# Patient Record
Sex: Male | Born: 1970 | Race: White | Hispanic: No | Marital: Married | Smoking: Never smoker
Health system: Southern US, Community
[De-identification: ages and names within clinical notes are randomized; demographics above are authoritative.]

## PROBLEM LIST (undated history)

## (undated) DIAGNOSIS — K5792 Diverticulitis of intestine, part unspecified, without perforation or abscess without bleeding: Secondary | ICD-10-CM

## (undated) HISTORY — DX: Diverticulitis of intestine, part unspecified, without perforation or abscess without bleeding: K57.92

## (undated) HISTORY — PX: FRACTURE SURGERY: SHX138

## (undated) HISTORY — PX: VASECTOMY: SHX75

---

## 2013-10-09 ENCOUNTER — Ambulatory Visit (INDEPENDENT_AMBULATORY_CARE_PROVIDER_SITE_OTHER): Payer: 59 | Admitting: Family Medicine

## 2013-10-09 VITALS — BP 120/80 | HR 75 | Temp 98.1°F | Resp 16 | Ht 67.0 in | Wt 181.0 lb

## 2013-10-09 DIAGNOSIS — S39012A Strain of muscle, fascia and tendon of lower back, initial encounter: Secondary | ICD-10-CM

## 2013-10-09 DIAGNOSIS — M549 Dorsalgia, unspecified: Secondary | ICD-10-CM

## 2013-10-09 DIAGNOSIS — S335XXA Sprain of ligaments of lumbar spine, initial encounter: Secondary | ICD-10-CM

## 2013-10-09 MED ORDER — CYCLOBENZAPRINE HCL 10 MG PO TABS: 10.0000 mg | ORAL_TABLET | Freq: Three times a day (TID) | ORAL | Status: DC | PRN

## 2013-10-09 MED ORDER — HYDROCODONE-ACETAMINOPHEN 5-325 MG PO TABS: 1.0000 | ORAL_TABLET | Freq: Four times a day (QID) | ORAL | Status: DC | PRN

## 2013-10-09 MED ORDER — NAPROXEN 500 MG PO TABS: 500.0000 mg | ORAL_TABLET | Freq: Two times a day (BID) | ORAL | Status: DC

## 2013-10-09 NOTE — Progress Notes (Addendum)
Subjective: A week ago on Sunday patient was exercising lifting a heavy tire at work and strained his back a little bit. The next day [place] is something concrete at home and strained it badly. It is persisted in hurting. He did work 1 day, missed a day or 2. He is scheduled to work Monday again. He is a Airline pilot. No prior back injuries. He did not file this as workers comp.  Objective Significant distress with pain in his right low back. Abdomen soft without mass or tenderness. No left-sided tenderness. On the right side above the SI and iliac crest she is extremely tender. Spine is nontender. Straight leg raising is negative but the muscles tighten up. No radiation of pain.  Assessment: Muscle strain low back Muscle pain low back  Been: Muscle relaxants, anti-inflammatory, and pain medication Gave him a care of your back booklet. 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 I gave him an excuse to be out of his Monday and Tuesday classes, but he is to return if worse. If he is medically better he can return to class. A long discussion about care of his back.

## 2013-10-09 NOTE — Patient Instructions (Addendum)
Take the Flexeril one half to one pill 3 times daily as needed for muscle relaxant  Take the naproxen one twice daily with breakfast and supper  Use the hydrocodone pain medication every 4-6 hours if needed for severe pain  Return if worse or not improving  Use heat or ice or alternate the 2 as needed. Gentle massage might help. Avoid lifting and straining. Do gentle stretches of the back.  Read the back owners manual.  Use work excuse if not doing much much better by Monday. If symptoms persist such that you cannot go to work by Wednesday please return

## 2014-04-10 ENCOUNTER — Encounter (HOSPITAL_COMMUNITY): Payer: Self-pay | Admitting: Emergency Medicine

## 2014-04-10 ENCOUNTER — Emergency Department (HOSPITAL_COMMUNITY)
Admission: EM | Admit: 2014-04-10 | Discharge: 2014-04-10 | Disposition: A | Discharge status: Nursing Home (Includes ICF) | Payer: 59 | Source: Home / Self Care | Attending: Family Medicine | Admitting: Family Medicine

## 2014-04-10 ENCOUNTER — Emergency Department (HOSPITAL_COMMUNITY): Payer: 59

## 2014-04-10 ENCOUNTER — Emergency Department (HOSPITAL_COMMUNITY)
Admission: EM | Admit: 2014-04-10 | Discharge: 2014-04-10 | Disposition: A | Discharge status: Home / Self Care | Payer: 59 | Attending: Emergency Medicine | Admitting: Emergency Medicine

## 2014-04-10 DIAGNOSIS — R109 Unspecified abdominal pain: Secondary | ICD-10-CM

## 2014-04-10 DIAGNOSIS — Z9852 Vasectomy status: Secondary | ICD-10-CM | POA: Insufficient documentation

## 2014-04-10 DIAGNOSIS — N23 Unspecified renal colic: Secondary | ICD-10-CM | POA: Insufficient documentation

## 2014-04-10 LAB — URINALYSIS, ROUTINE W REFLEX MICROSCOPIC
BILIRUBIN URINE: NEGATIVE
Glucose, UA: NEGATIVE mg/dL
Hgb urine dipstick: NEGATIVE
Ketones, ur: NEGATIVE mg/dL
Leukocytes, UA: NEGATIVE
Nitrite: NEGATIVE
Protein, ur: NEGATIVE mg/dL
Specific Gravity, Urine: 1.023 (ref 1.005–1.030)
Urobilinogen, UA: 0.2 mg/dL (ref 0.0–1.0)
pH: 7 (ref 5.0–8.0)

## 2014-04-10 LAB — COMPREHENSIVE METABOLIC PANEL
ALT: 44 U/L (ref 0–53)
AST: 24 U/L (ref 0–37)
Albumin: 4.8 g/dL (ref 3.5–5.2)
Alkaline Phosphatase: 77 U/L (ref 39–117)
Anion gap: 18 — ABNORMAL HIGH (ref 5–15)
BILIRUBIN TOTAL: 1.2 mg/dL (ref 0.3–1.2)
BUN: 14 mg/dL (ref 6–23)
CHLORIDE: 103 meq/L (ref 96–112)
CO2: 20 meq/L (ref 19–32)
CREATININE: 0.82 mg/dL (ref 0.50–1.35)
Calcium: 9.9 mg/dL (ref 8.4–10.5)
GLUCOSE: 158 mg/dL — AB (ref 70–99)
Potassium: 4.1 mEq/L (ref 3.7–5.3)
Sodium: 141 mEq/L (ref 137–147)
Total Protein: 7.8 g/dL (ref 6.0–8.3)

## 2014-04-10 LAB — CBC WITH DIFFERENTIAL/PLATELET
BASOS ABS: 0 10*3/uL (ref 0.0–0.1)
Basophils Relative: 0 % (ref 0–1)
EOS PCT: 2 % (ref 0–5)
Eosinophils Absolute: 0.2 10*3/uL (ref 0.0–0.7)
HCT: 47.1 % (ref 39.0–52.0)
Hemoglobin: 17.2 g/dL — ABNORMAL HIGH (ref 13.0–17.0)
LYMPHS PCT: 27 % (ref 12–46)
Lymphs Abs: 2.2 10*3/uL (ref 0.7–4.0)
MCH: 32.8 pg (ref 26.0–34.0)
MCHC: 36.5 g/dL — ABNORMAL HIGH (ref 30.0–36.0)
MCV: 89.7 fL (ref 78.0–100.0)
Monocytes Absolute: 0.4 10*3/uL (ref 0.1–1.0)
Monocytes Relative: 6 % (ref 3–12)
NEUTROS PCT: 65 % (ref 43–77)
Neutro Abs: 5.2 10*3/uL (ref 1.7–7.7)
PLATELETS: 283 10*3/uL (ref 150–400)
RBC: 5.25 MIL/uL (ref 4.22–5.81)
RDW: 11.5 % (ref 11.5–15.5)
WBC: 8 10*3/uL (ref 4.0–10.5)

## 2014-04-10 LAB — LIPASE, BLOOD: Lipase: 35 U/L (ref 11–59)

## 2014-04-10 MED ORDER — SODIUM CHLORIDE 0.9 % IV BOLUS (SEPSIS)
1000.0000 mL | Freq: Once | INTRAVENOUS | Status: AC
  Administered 2014-04-10: 1000 mL via INTRAVENOUS

## 2014-04-10 MED ORDER — ONDANSETRON HCL 4 MG/2ML IJ SOLN
4.0000 mg | Freq: Once | INTRAMUSCULAR | Status: AC
  Administered 2014-04-10: 4 mg via INTRAVENOUS
  Filled 2014-04-10: qty 2

## 2014-04-10 MED ORDER — OXYCODONE-ACETAMINOPHEN 5-325 MG PO TABS: 1.0000 | ORAL_TABLET | Freq: Four times a day (QID) | ORAL | Status: DC | PRN

## 2014-04-10 MED ORDER — MORPHINE SULFATE 4 MG/ML IJ SOLN: 4.0000 mg | Freq: Once | INTRAMUSCULAR | Status: DC

## 2014-04-10 MED ORDER — HYDROMORPHONE HCL 1 MG/ML IJ SOLN
1.0000 mg | Freq: Once | INTRAMUSCULAR | Status: AC
  Administered 2014-04-10: 1 mg via INTRAVENOUS
  Filled 2014-04-10: qty 1

## 2014-04-10 MED ORDER — ONDANSETRON HCL 4 MG PO TABS: 4.0000 mg | ORAL_TABLET | Freq: Four times a day (QID) | ORAL | Status: DC

## 2014-04-10 NOTE — ED Provider Notes (Signed)
CSN: [id number]     Arrival date & time 04/10/14  1011 History   First MD Initiated Contact with Patient 04/10/14 1034     Chief Complaint  Patient presents with  . Abdominal Pain     (Consider location/radiation/quality/duration/timing/severity/associated sxs/prior Treatment) HPI  Patient to the ED with complaints of severe abdominal pains acute onset this morning. It is left sided flank, back pain and lower left abdominal pain that radiates down into his groin. The patient is unable to sit still and is pacing around the room. He denies any hematuria, diarrhea, fevers, chills, vomiting. Feels nauseas, denies hx of stones or intraabdominal surgery.  History reviewed. No pertinent past medical history. Past Surgical History  Procedure Laterality Date  . Fracture surgery    . Vasectomy     No family history on file. History  Substance Use Topics  . Smoking status: Never Smoker   . Smokeless tobacco: Not on file  . Alcohol Use: No    Review of Systems  All other systems reviewed and are negative.     Allergies  Review of patient's allergies indicates no known allergies.  Home Medications   Prior to Admission medications   Medication Sig Start Date End Date Taking? Authorizing Provider  ibuprofen (ADVIL,MOTRIN) 200 MG tablet Take 400 mg by mouth every 6 (six) hours as needed (pain).   Yes Historical Provider, MD  ondansetron (ZOFRAN) 4 MG tablet Take 1 tablet (4 mg total) by mouth every 6 (six) hours. 04/10/14   Mable Dara Marilu Favre, PA-C  oxyCODONE-acetaminophen (PERCOCET/ROXICET) 5-325 MG per tablet Take 1-2 tablets by mouth every 6 (six) hours as needed. 04/10/14   Starlyn Droge Marilu Favre, PA-C   BP 118/77  Pulse 51  Temp(Src) 98.2 F (36.8 C) (Oral)  Resp 20  SpO2 99% Physical Exam  Nursing note and vitals reviewed. Constitutional: He appears well-developed and well-nourished. He appears distressed (severe pain).  HENT:  Head: Normocephalic and atraumatic.  Eyes: Pupils are  equal, round, and reactive to light.  Neck: Normal range of motion. Neck supple.  Cardiovascular: Normal rate and regular rhythm.   Pulmonary/Chest: Effort normal.  Abdominal: Soft. Bowel sounds are normal. There is tenderness (severe) in the right lower quadrant. There is guarding and CVA tenderness (left). There is no rebound.  Neurological: He is alert.  Skin: Skin is warm. He is diaphoretic.    ED Course  Procedures (including critical care time) Labs Review Labs Reviewed  COMPREHENSIVE METABOLIC PANEL - Abnormal; Notable for the following:    Glucose, Bld 158 (*)    Anion gap 18 (*)    All other components within normal limits  CBC WITH DIFFERENTIAL - Abnormal; Notable for the following:    Hemoglobin 17.2 (*)    MCHC 36.5 (*)    All other components within normal limits  LIPASE, BLOOD  URINALYSIS, ROUTINE W REFLEX MICROSCOPIC    Imaging Review Ct Abdomen Pelvis Wo Contrast  04/10/2014   CLINICAL DATA:  Left flank pain.  EXAM: CT ABDOMEN AND PELVIS WITHOUT CONTRAST  TECHNIQUE: Multidetector CT imaging of the abdomen and pelvis was performed following the standard protocol without IV contrast.  COMPARISON:  None.  FINDINGS: Dependent atelectasis at the lung bases.  No evidence for free air.  Normal appearance of both kidneys without kidney stones or hydronephrosis. Normal appearance of the urinary bladder.  There are multiple low-density structures in the left hepatic lobe. The largest is in the lateral left hepatic lobe and measures up to  1.3 cm. These most likely represent small hepatic cysts. Normal appearance of the gallbladder. Normal appearance of the spleen, pancreas and adrenal glands.  There is mild stranding within the central abdominal mesentery which is nonspecific finding. There is no significant free fluid or lymphadenopathy. Few calcifications in the prostate gland. There is a retrocecal appendix without inflammation. No acute abnormality to the small or large bowel.  No  acute bone abnormality.  IMPRESSION: No acute abnormality in the abdomen or pelvis. Negative for kidney stones or hydronephrosis.  Multiple small low-density structures scattered throughout the liver. These are indeterminate on this non contrast examination but likely represent small cysts.  Mild stranding in the central abdominal mesentery without lymphadenopathy. This is probably an incidental finding.   Electronically Signed   By: Markus Daft M.D.   On: 04/10/2014 13:53     EKG Interpretation None      MDM   Final diagnoses:  Flank pain  Ureteral colic    Patients pain was left flank, low left back, LLQ and radiated to his groin. He was writhing in pain initially, unable to sit still and diaphoretic from the pain.  This resolved with IV pain medications. His urinalysis and labs have resulted not showing any abnormalities. His CT scan also shows no abnormalities. He is being clinically diagnosed with ureteral colic and flank pain as his symptoms are most consistent with this. He and his wife have been made aware that an early presentation of another illness may be brewing and if he develops new symptoms (n/v/dfevers/AMS) he needs to be seen right away.   Rx; Percocet and Zofran.  43 y.o.Gregory Bernard's evaluation in the Emergency Department is complete. It has been determined that no acute conditions requiring further emergency intervention are present at this time. The patient/guardian have been advised of the diagnosis and plan. We have discussed signs and symptoms that warrant return to the ED, such as changes or worsening in symptoms.  Vital signs are stable at discharge. Filed Vitals:   04/10/14 1345  BP: 118/77  Pulse: 51  Temp:   Resp:     Patient/guardian has voiced understanding and agreed to follow-up with the PCP or specialist.     Linus Mako, PA-C 04/10/14 1421

## 2014-04-10 NOTE — ED Notes (Signed)
Patient states having left sided flank pain that started this am Having lower abd pain as well

## 2014-04-10 NOTE — ED Notes (Signed)
Pt. Stated, lower abdominal pain that goess all across.  Its so much pressure. It goes to the groin area.

## 2014-04-10 NOTE — ED Provider Notes (Signed)
Medical screening examination/treatment/procedure(s) were performed by non-physician practitioner and as supervising physician I was immediately available for consultation/collaboration.     Veryl Speak, MD 04/10/14 1550

## 2014-04-10 NOTE — ED Provider Notes (Signed)
Gregory Bernard is a 43 y.o. male who presents to Urgent Care today for severe abdominal pain. Patient will this morning with sudden onset of severe left-sided flank pain radiating to his groin. He notes severe bilateral lower groin pain. He feels nauseated but has not vomited. He denies any blood in the urine but is unable to urinate currently. He denies any diarrhea. No fevers or chills. Patient tried some over-the-counter medications this morning which did not help. He denies any history of abdominal surgery or kidney stones.   History reviewed. No pertinent past medical history. History  Substance Use Topics  . Smoking status: Never Smoker   . Smokeless tobacco: Not on file  . Alcohol Use: No   ROS as above Medications: No current facility-administered medications for this encounter.   Current Outpatient Prescriptions  Medication Sig Dispense Refill  . cyclobenzaprine (FLEXERIL) 10 MG tablet Take 1 tablet (10 mg total) by mouth 3 (three) times daily as needed for muscle spasms.  20 tablet  1  . HYDROcodone-acetaminophen (NORCO) 5-325 MG per tablet Take 1 tablet by mouth every 6 (six) hours as needed.  30 tablet  0  . naproxen (NAPROSYN) 500 MG tablet Take 1 tablet (500 mg total) by mouth 2 (two) times daily with a meal.  30 tablet  0    Exam:  BP 144/91  Pulse 60  Temp(Src) 98.7 F (37.1 C) (Oral)  Resp 17  SpO2 100% Gen: In pain appearing HEENT: EOMI,  MMM Lungs: Normal work of breathing. CTABL Heart: RRR no MRG Abd:  hypoactive bowel sounds. Tender to percussion left flank. Tender to palpation bilateral lower abdomen with rebound and guarding present  Exts: Brisk capillary refill, warm and well perfused.   No results found for this or any previous visit (from the past 24 hour(s)). No results found.  Assessment and Plan: 43 y.o. male with  of left flank pain with severe bilateral lower abdominal pain with rebound and guarding. This is concerning for kidney stone or  appendicitis or diverticulitis. Essentially this patient requires more of a workup and we are able to perform at the urgent care location. Plan to transfer to the emergency department for further evaluation and management. I offered IV analgesics and transfer to the EMS. Patient declined. His wife will drive him to the emergency room now. Vital signs stable for transfer.  Discussed warning signs or symptoms. Please see discharge instructions. Patient expresses understanding.     Gregory Hams, MD 04/10/14 [phone]

## 2014-04-10 NOTE — ED Notes (Signed)
Pt. Reports sudden onset left flank pain radiating down to groin this AM. Denies urinary symptoms. Lower abdomen tender to touch. Went to urgent care first and sent here for further evaluation. Pt. Can not sit still in bed, appears to be in pain.

## 2014-04-10 NOTE — Discharge Instructions (Signed)
Flank Pain Flank pain refers to pain that is located on the side of the body between the upper abdomen and the back. The pain may occur over a short period of time (acute) or may be long-term or reoccurring (chronic). It may be mild or severe. Flank pain can be caused by many things. CAUSES  Some of the more common causes of flank pain include:  Muscle strains.   Muscle spasms.   A disease of your spine (vertebral disk disease).   A lung infection (pneumonia).   Fluid around your lungs (pulmonary edema).   A kidney infection.   Kidney stones.   A very painful skin rash caused by the chickenpox virus (shingles).   Gallbladder disease.  [place] care will depend on the cause of your pain. In general,  Rest as directed by your caregiver.  Drink enough fluids to keep your urine clear or pale yellow.  Only take over-the-counter or prescription medicines as directed by your caregiver. Some medicines may help relieve the pain.  Tell your caregiver about any changes in your pain.  Follow up with your caregiver as directed. SEEK IMMEDIATE MEDICAL CARE IF:   Your pain is not controlled with medicine.   You have new or worsening symptoms.  Your pain increases.   You have abdominal pain.   You have shortness of breath.   You have persistent nausea or vomiting.   You have swelling in your abdomen.   You feel faint or pass out.   You have blood in your urine.  You have a fever or persistent symptoms for more than 2-3 days.  You have a fever and your symptoms suddenly get worse. MAKE SURE YOU:   Understand these instructions.  Will watch your condition.  Will get help right away if you are not doing well or get worse. Document Released: 08/22/2005 Document Revised: 03/25/2012 Document Reviewed: 02/13/2012 [facility] Patient Information 2015 [place], [place]. This information is not intended to replace advice given to you by your  health care provider. Make sure you discuss any questions you have with your health care provider. Ureteral Colic (Kidney Stones) Ureteral colic is the result of a condition when kidney stones form inside the kidney. Once kidney stones are formed they may move into the tube that connects the kidney with the bladder (ureter). If this occurs, this condition may cause pain (colic) in the ureter.  CAUSES  Pain is caused by stone movement in the ureter and the obstruction caused by the stone. SYMPTOMS  The pain comes and goes as the ureter contracts around the stone. The pain is usually intense, sharp, and stabbing in character. The location of the pain may move as the stone moves through the ureter. When the stone is near the kidney the pain is usually located in the back and radiates to the belly (abdomen). When the stone is ready to pass into the bladder the pain is often located in the lower abdomen on the side the stone is located. At this location, the symptoms may mimic those of a urinary tract infection with urinary frequency. Once the stone is located here it often passes into the bladder and the pain disappears completely. TREATMENT   Your caregiver will provide you with medicine for pain relief.  You may require specialized follow-up X-rays.  The absence of pain does not always mean that the stone has passed. It may have just stopped moving. If the urine remains completely obstructed, it can  cause loss of kidney function or even complete destruction of the involved kidney. It is your responsibility and in your interest that X-rays and follow-ups as suggested by your caregiver are completed. Relief of pain without passage of the stone can be associated with severe damage to the kidney, including loss of kidney function on that side.  If your stone does not pass on its own, additional measures may be taken by your caregiver to ensure its removal. HOME CARE INSTRUCTIONS   Increase your fluid  intake. Water is the preferred fluid since juices containing vitamin C may acidify the urine making it less likely for certain stones (uric acid stones) to pass.  Strain all urine. A strainer will be provided. Keep all particulate matter or stones for your caregiver to inspect.  Take your pain medicine as directed.  Make a follow-up appointment with your caregiver as directed.  Remember that the goal is passage of your stone. The absence of pain does not mean the stone is gone. Follow your caregiver's instructions.  Only take over-the-counter or prescription medicines for pain, discomfort, or fever as directed by your caregiver. SEEK MEDICAL CARE IF:   Pain cannot be controlled with the prescribed medicine.  You have a fever.  Pain continues for longer than your caregiver advises it should.  There is a change in the pain, and you develop chest discomfort or constant abdominal pain.  You feel faint or pass out. MAKE SURE YOU:   Understand these instructions.  Will watch your condition.  Will get help right away if you are not doing well or get worse. Document Released: 04/10/2005 Document Revised: 10/26/2012 Document Reviewed: 12/26/2010 [facility] Patient Information 2015 [place], [place]. This information is not intended to replace advice given to you by your health care provider. Make sure you discuss any questions you have with your health care provider.

## 2017-01-20 ENCOUNTER — Encounter: Payer: Self-pay | Admitting: Adult Health

## 2017-01-20 ENCOUNTER — Ambulatory Visit (INDEPENDENT_AMBULATORY_CARE_PROVIDER_SITE_OTHER): Payer: 59 | Admitting: Adult Health

## 2017-01-20 VITALS — BP 118/73 | HR 93 | Ht 67.0 in | Wt 179.0 lb

## 2017-01-20 DIAGNOSIS — L259 Unspecified contact dermatitis, unspecified cause: Secondary | ICD-10-CM | POA: Insufficient documentation

## 2017-01-20 DIAGNOSIS — R21 Rash and other nonspecific skin eruption: Secondary | ICD-10-CM | POA: Diagnosis not present

## 2017-01-20 DIAGNOSIS — Z833 Family history of diabetes mellitus: Secondary | ICD-10-CM | POA: Insufficient documentation

## 2017-01-20 DIAGNOSIS — J029 Acute pharyngitis, unspecified: Secondary | ICD-10-CM | POA: Diagnosis not present

## 2017-01-20 DIAGNOSIS — Z0000 Encounter for general adult medical examination without abnormal findings: Secondary | ICD-10-CM | POA: Insufficient documentation

## 2017-01-20 DIAGNOSIS — L255 Unspecified contact dermatitis due to plants, except food: Secondary | ICD-10-CM

## 2017-01-20 LAB — POCT GLYCOSYLATED HEMOGLOBIN (HGB A1C): HEMOGLOBIN A1C: 5.9

## 2017-01-20 MED ORDER — PREDNISONE 20 MG PO TABS: 20.0000 mg | ORAL_TABLET | Freq: Every day | ORAL | 0 refills | Status: DC

## 2017-01-20 NOTE — Assessment & Plan Note (Signed)
A1c today 5.9 Advised that he is pre-diabetic and needs to reduce sugar/CHO intake and increase regular exercise.

## 2017-01-20 NOTE — Progress Notes (Signed)
Subjective:    Patient ID: Gregory Bernard, male    DOB: October 08, 1970, 46 y.o.   MRN: [id number]  HPI:  Gregory Bernard is here to establish as a new pt.  He is a pleasant 46 year old male.  PMH:  HL and diffuse rash on back that has been present for years.  Significant family hx of diabetes-mother and siblings.  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  He denies tobacco/EOTH use.  He reports drinking > 80 ounces water/daily, however also consumes a substantial amount of sweet tea.  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 He has not had regular, routine medical care in years, with only healthcare at annual [facility] for fire department. He denies regular rx medication. One acute complaint- productive cough that developed two weeks ago.  He took 5 days of friend Amoxicillin and sx's improved, however he has lingering non-productive cough with sore throat (3/10).  He denies fever/night sweats/N/V/D.  Patient Care Team    Relationship Specialty Notifications Start End  Gregory Bernard, Gregory Spare, NP PCP - General Family Medicine  01/20/17     There are no active problems to display for this patient.    Past Medical History:  Diagnosis Date  . Diverticulitis      Past Surgical History:  Procedure Laterality Date  . FRACTURE SURGERY    . VASECTOMY       Family History  Problem Relation Age of Onset  . Heart attack Mother   . Diabetes Mother   . Hiatal hernia Father   . Dementia Father   . Diabetes Brother      History  Drug Use No     History  Alcohol Use No     History  Smoking Status  . Never Smoker  Smokeless Tobacco  . Never Used     Outpatient Encounter Prescriptions as of 01/20/2017  Medication Sig  . Dextromethorphan-Guaifenesin (MUCINEX DM PO) Take by mouth.  . fexofenadine (ALLEGRA) 30 MG tablet Take 30 mg by mouth as needed.  Marland Kitchen ibuprofen (ADVIL,MOTRIN) 200 MG tablet Take  400 mg by mouth every 6 (six) hours as needed (pain).  . predniSONE (DELTASONE) 20 MG tablet Take 1 tablet (20 mg total) by mouth daily with breakfast. 3 tabs day one. 2 tabs days two-five  . [DISCONTINUED] ondansetron (ZOFRAN) 4 MG tablet Take 1 tablet (4 mg total) by mouth every 6 (six) hours.  . [DISCONTINUED] oxyCODONE-acetaminophen (PERCOCET/ROXICET) 5-325 MG per tablet Take 1-2 tablets by mouth every 6 (six) hours as needed.   No facility-administered encounter medications on file as of 01/20/2017.     Allergies: Patient has no known allergies.  Body mass index is 28.04 kg/m.  Blood pressure 118/73, pulse 93, height 5\' 7"  (1.702 m), weight 179 lb (81.2 kg).     Review of Systems  Constitutional: Positive for fatigue. Negative for activity change, appetite change, chills, diaphoresis, fever and unexpected weight change.  HENT: Positive for congestion, postnasal drip and sore throat. Negative for sinus pain, sinus pressure, sneezing, trouble swallowing and voice change.   Eyes: Negative for visual disturbance.  Respiratory: Positive for cough. Negative for chest tightness, shortness of breath, wheezing and stridor.   Cardiovascular: Negative for chest pain, palpitations and leg swelling.  Endocrine: Negative for cold intolerance, heat intolerance, polydipsia, polyphagia and polyuria.  Genitourinary: Negative for difficulty urinating, flank pain and hematuria.  Musculoskeletal: Negative  for arthralgias, back pain, gait problem, joint swelling, myalgias, neck pain and neck stiffness.  Skin: Positive for rash. Negative for color change and wound.  Allergic/Immunologic: Negative for immunocompromised state.  Neurological: Negative for dizziness and headaches.  Hematological: Does not bruise/bleed easily.  Psychiatric/Behavioral: Positive for sleep disturbance.       Objective:   Physical Exam  Constitutional: He is oriented to person, place, and time. He appears well-developed and  well-nourished. No distress.  HENT:  Head: Normocephalic and atraumatic.  Right Ear: Hearing, tympanic membrane, external ear and ear canal normal. Tympanic membrane is not perforated and not bulging. No decreased hearing is noted.  Left Ear: Hearing, tympanic membrane, external ear and ear canal normal. Tympanic membrane is not perforated and not bulging. No decreased hearing is noted.  Nose: Rhinorrhea present. No mucosal edema. Right sinus exhibits no maxillary sinus tenderness and no frontal sinus tenderness. Left sinus exhibits no maxillary sinus tenderness and no frontal sinus tenderness.  Mouth/Throat: Uvula is midline and mucous membranes are normal. Posterior oropharyngeal edema and posterior oropharyngeal erythema present. No oropharyngeal exudate or tonsillar abscesses.  Eyes: Conjunctivae are normal. Pupils are equal, round, and reactive to light.  Neck: Normal range of motion. Neck supple.  Cardiovascular: Normal rate, regular rhythm, normal heart sounds and intact distal pulses.   No murmur heard. Pulmonary/Chest: Effort normal and breath sounds normal. No respiratory distress. He has no wheezes. He has no rales. He exhibits no tenderness.  Lymphadenopathy:    He has no cervical adenopathy.  Neurological: He is alert and oriented to person, place, and time. Coordination normal.  Skin: Skin is warm and dry. Rash noted. He is not diaphoretic. No erythema. No pallor.  Diffuse, flat, reddened rash on back. Contact dermatitis noted on lower extremities with one area of excoriation on L calf-no signs of infection.  Psychiatric: He has a normal mood and affect. His behavior is normal. Judgment and thought content normal.  Nursing note and vitals reviewed.         Assessment & Plan:   1. Rash of back   2. Family history of type II diabetes mellitus   3. Contact dermatitis due to plants, except food, unspecified contact dermatitis type   4. Pharyngitis, unspecified etiology   5.  Healthcare maintenance     Rash of back Dermatology referral placed.  Family history of type II diabetes mellitus A1c today 5.9 Advised that he is pre-diabetic and needs to reduce sugar/CHO intake and increase regular exercise.  Contact dermatitis Came into contact with poison ivy last week while working at his [employer]. Prednisone taper provided. Advised not to scratch.  Pharyngitis Prednisone taper provided. Increase fluids/vit c Warm salt water gargle. Please call clinic if sx's persist after prednisone.  Healthcare maintenance Increase regular movement.  Follow heart healthy diet. Increase water intake and reduce sweet tea consumption. Increase regular exercise-jogging, walking, biking, weight training. A1c today-5.9, which means you are pre-diabetic.  Please reduce sugar (sweet tea) and carbohydrate intake. Dermatology referral placed. Please send in labs from beginning of the year. Recommend complete physical this Fall. Prednisone taper for contact dermatitis, sore throat. If cough/congestion/sore throat does not improve after Prednisone complete, then please call clinic. Please do not share antibiotics with others, increases risk of developing medication resistance.     FOLLOW-UP:  Return in about 3 months (around 04/22/2017) for CPE.

## 2017-01-20 NOTE — Patient Instructions (Addendum)
Heart-Healthy Eating Plan Many factors influence your heart health, including eating and exercise habits. Heart (coronary) risk increases with abnormal blood fat (lipid) levels. Heart-healthy meal planning includes limiting unhealthy fats, increasing healthy fats, and making other small dietary changes. This includes maintaining a healthy body weight to help keep lipid levels within a normal range. What is my plan? Your health care provider recommends that you:  Get no more than _________% of the total calories in your daily diet from fat.  Limit your intake of saturated fat to less than _________% of your total calories each day.  Limit the amount of cholesterol in your diet to less than _________ mg per day.  What types of fat should I choose?  Choose healthy fats more often. Choose monounsaturated and polyunsaturated fats, such as olive oil and canola oil, flaxseeds, walnuts, almonds, and seeds.  Eat more omega-3 fats. Good choices include salmon, mackerel, sardines, tuna, flaxseed oil, and ground flaxseeds. Aim to eat fish at least two times each week.  Limit saturated fats. Saturated fats are primarily found in animal products, such as meats, butter, and cream. Plant sources of saturated fats include palm oil, palm kernel oil, and coconut oil.  Avoid foods with partially hydrogenated oils in them. These contain trans fats. Examples of foods that contain trans fats are stick margarine, some tub margarines, cookies, crackers, and other baked goods. What general guidelines do I need to follow?  Check food labels carefully to identify foods with trans fats or high amounts of saturated fat.  Fill one half of your plate with vegetables and green salads. Eat 4-5 servings of vegetables per day. A serving of vegetables equals 1 cup of raw leafy vegetables,  cup of raw or cooked cut-up vegetables, or  cup of vegetable juice.  Fill one fourth of your plate with whole grains. Look for the word  "whole" as the first word in the ingredient list.  Fill one fourth of your plate with lean protein foods.  Eat 4-5 servings of fruit per day. A serving of fruit equals one medium whole fruit,  cup of dried fruit,  cup of fresh, frozen, or canned fruit, or  cup of 100% fruit juice.  Eat more foods that contain soluble fiber. Examples of foods that contain this type of fiber are apples, broccoli, carrots, beans, peas, and barley. Aim to get 20-30 g of fiber per day.  Eat more home-cooked food and less restaurant, buffet, and fast food.  Limit or avoid alcohol.  Limit foods that are high in starch and sugar.  Avoid fried foods.  Cook foods by using methods other than frying. Baking, boiling, grilling, and broiling are all great options. Other fat-reducing suggestions include: ? Removing the skin from poultry. ? Removing all visible fats from meats. ? Skimming the fat off of stews, soups, and gravies before serving them. ? Steaming vegetables in water or broth.  Lose weight if you are overweight. Losing just 5-10% of your initial body weight can help your overall health and prevent diseases such as diabetes and heart disease.  Increase your consumption of nuts, legumes, and seeds to 4-5 servings per week. One serving of dried beans or legumes equals  cup after being cooked, one serving of nuts equals 1 ounces, and one serving of seeds equals  ounce or 1 tablespoon.  You may need to monitor your salt (sodium) intake, especially if you have high blood pressure. Talk with your health care provider or dietitian to get  more information about reducing sodium. What foods can I eat? Grains  Breads, including [place], [place], pita, wheat, raisin, rye, oatmeal, and [place]. Tortillas that are neither fried nor made with lard or trans fat. Low-fat rolls, including hotdog and hamburger buns and English muffins. Biscuits. Muffins. Waffles. Pancakes. Light popcorn. Whole-grain cereals. Flatbread.  Melba toast. Pretzels. Breadsticks. Rusks. Low-fat snacks and crackers, including oyster, saltine, matzo, graham, animal, and rye. Rice and pasta, including brown rice and those that are made with whole wheat. Vegetables All vegetables. Fruits All fruits, but limit coconut. Meats and Other Protein Sources Lean, well-trimmed beef, veal, pork, and lamb. Chicken and [place] without skin. All fish and shellfish. Wild duck, rabbit, pheasant, and venison. Egg whites or low-cholesterol egg substitutes. Dried beans, peas, lentils, and tofu.Seeds and most nuts. Dairy Low-fat or nonfat cheeses, including ricotta, string, and mozzarella. Skim or 1% milk that is liquid, powdered, or evaporated. Buttermilk that is made with low-fat milk. Nonfat or low-fat yogurt. Beverages Mineral water. Diet carbonated beverages. Sweets and Desserts Sherbets and fruit ices. Honey, jam, marmalade, jelly, and syrups. Meringues and gelatins. Pure sugar candy, such as hard candy, jelly beans, gumdrops, mints, marshmallows, and small amounts of dark chocolate. [employer] Eat all sweets and desserts in moderation. Fats and Oils Nonhydrogenated (trans-free) margarines. Vegetable oils, including soybean, sesame, sunflower, olive, peanut, safflower, corn, canola, and cottonseed. Salad dressings or mayonnaise that are made with a vegetable oil. Limit added fats and oils that you use for cooking, baking, salads, and as spreads. Other Cocoa powder. Coffee and tea. All seasonings and condiments. The items listed above may not be a complete list of recommended foods or beverages. Contact your dietitian for more options. What foods are not recommended? Grains Breads that are made with saturated or trans fats, oils, or whole milk. Croissants. Butter rolls. Cheese breads. Sweet rolls. Donuts. Buttered popcorn. Chow mein noodles. High-fat crackers, such as cheese or butter crackers. Meats and Other Protein Sources Fatty meats, such  as hotdogs, short ribs, sausage, spareribs, bacon, ribeye roast or steak, and mutton. High-fat deli meats, such as salami and bologna. Caviar. Domestic duck and goose. Organ meats, such as kidney, liver, sweetbreads, brains, gizzard, chitterlings, and heart. Dairy Cream, sour cream, cream cheese, and creamed cottage cheese. Whole milk cheeses, including blue (bleu), Monte[place] Jack, [place], [place], [place], [place], [place], [place], [place], and [place]. Whole or 2% milk that is liquid, evaporated, or condensed. Whole buttermilk. Cream sauce or high-fat cheese sauce. Yogurt that is made from whole milk. Beverages Regular sodas and drinks with added sugar. Sweets and Desserts Frosting. Pudding. Cookies. Cakes other than angel food cake. Candy that has milk chocolate or [place] chocolate, hydrogenated fat, butter, coconut, or unknown ingredients. Buttered syrups. Full-fat ice cream or ice cream drinks. Fats and Oils Gravy that has suet, meat fat, or shortening. Cocoa butter, hydrogenated oils, palm oil, coconut oil, palm kernel oil. These can often be found in baked products, candy, fried foods, nondairy creamers, and whipped toppings. Solid fats and shortenings, including bacon fat, salt pork, lard, and butter. Nondairy cream substitutes, such as coffee creamers and sour cream substitutes. Salad dressings that are made of unknown oils, cheese, or sour cream. The items listed above may not be a complete list of foods and beverages to avoid. Contact your dietitian for more information. This information is not intended to replace advice given to you by your health care provider. Make sure you discuss any questions you have with your health care  provider. Document Released: 04/09/2008 Document Revised: 01/19/2016 Document Reviewed: 12/23/2013 Elsevier Interactive Patient Education  2017 [employer]   Increase regular movement.  Follow heart healthy diet. Increase water intake and reduce sweet tea  consumption. Increase regular exercise-jogging, walking, biking, weight training. A1c today-5.9, which means you are pre-diabetic.  Please reduce sugar (sweet tea) and carbohydrate intake. Dermatology referral placed. Please send in labs from beginning of the year. Recommend complete physical this Fall. Prednisone taper for contact dermatitis, sore throat. If cough/congestion/sore throat does not improve after Prednisone complete, then please call clinic. Please do not share antibiotics with others, increases risk of developing medication resistance. WELCOME TO THE PRACTICE.

## 2017-01-20 NOTE — Assessment & Plan Note (Signed)
Came into contact with poison ivy last week while working at his [employer]. Prednisone taper provided. Advised not to scratch.

## 2017-01-20 NOTE — Assessment & Plan Note (Signed)
Prednisone taper provided. Increase fluids/vit c Warm salt water gargle. Please call clinic if sx's persist after prednisone.

## 2017-01-20 NOTE — Assessment & Plan Note (Signed)
Increase regular movement.  Follow heart healthy diet. Increase water intake and reduce sweet tea consumption. Increase regular exercise-jogging, walking, biking, weight training. A1c today-5.9, which means you are pre-diabetic.  Please reduce sugar (sweet tea) and carbohydrate intake. Dermatology referral placed. Please send in labs from beginning of the year. Recommend complete physical this Fall. Prednisone taper for contact dermatitis, sore throat. If cough/congestion/sore throat does not improve after Prednisone complete, then please call clinic. Please do not share antibiotics with others, increases risk of developing medication resistance.

## 2017-01-20 NOTE — Assessment & Plan Note (Signed)
Dermatology referral placed

## 2017-03-10 DIAGNOSIS — D229 Melanocytic nevi, unspecified: Secondary | ICD-10-CM | POA: Diagnosis not present

## 2017-03-10 DIAGNOSIS — B360 Pityriasis versicolor: Secondary | ICD-10-CM | POA: Diagnosis not present

## 2017-05-16 ENCOUNTER — Ambulatory Visit: Payer: 59 | Admitting: Physician Assistant

## 2017-05-22 LAB — BASIC METABOLIC PANEL
BUN: 12 (ref 4–21)
Creatinine: 1 (ref <=1.3)
Glucose: 109
POTASSIUM: 4.6 (ref 3.4–5.3)
SODIUM: 139 (ref 137–147)

## 2017-05-22 LAB — LIPID PANEL
Cholesterol: 226 — AB (ref 0–200)
HDL: 35 (ref 35–70)
LDL Cholesterol: 160
Triglycerides: 163 — AB (ref 40–160)

## 2017-05-22 LAB — HEPATIC FUNCTION PANEL
ALT: 36 (ref 10–40)
AST: 23 (ref 14–40)
Alkaline Phosphatase: 67 (ref 25–125)
Bilirubin, Total: 1.1

## 2017-05-22 LAB — CBC AND DIFFERENTIAL
HCT: 47 (ref 41–53)
Hemoglobin: 16.5 (ref 13.5–17.5)
Neutrophils Absolute: 2430
Platelets: 277 (ref 150–399)
WBC: 6

## 2017-08-04 ENCOUNTER — Encounter: Payer: Self-pay | Admitting: Adult Health

## 2017-08-04 ENCOUNTER — Ambulatory Visit (INDEPENDENT_AMBULATORY_CARE_PROVIDER_SITE_OTHER): Payer: 59 | Admitting: Adult Health

## 2017-08-04 VITALS — BP 106/70 | HR 71 | Ht 67.0 in | Wt 181.4 lb

## 2017-08-04 DIAGNOSIS — E7800 Pure hypercholesterolemia, unspecified: Secondary | ICD-10-CM | POA: Diagnosis not present

## 2017-08-04 DIAGNOSIS — Z833 Family history of diabetes mellitus: Secondary | ICD-10-CM | POA: Diagnosis not present

## 2017-08-04 DIAGNOSIS — Z0000 Encounter for general adult medical examination without abnormal findings: Secondary | ICD-10-CM

## 2017-08-04 DIAGNOSIS — E1169 Type 2 diabetes mellitus with other specified complication: Secondary | ICD-10-CM | POA: Insufficient documentation

## 2017-08-04 LAB — PULMONARY FUNCTION TEST

## 2017-08-04 NOTE — Addendum Note (Signed)
Addended by: Fonnie Mu on: 08/04/2017 04:56 PM   Modules accepted: Orders

## 2017-08-04 NOTE — Assessment & Plan Note (Signed)
Increase water intake, strive for at least 90 ounces/day.   To reduce your LDL (bad cholesterol) and increase your HDL (good cholesterol)- Follow Heart Healthy diet Increase regular exercise.  Recommend at least 30 minutes daily, 5 days per week of walking, jogging, biking, swimming, YouTube/Pinterest workout videos. Follow-up in 6 months for complete physical with fasting labs [place]!

## 2017-08-04 NOTE — Patient Instructions (Signed)
Heart-Healthy Eating Plan Many factors influence your heart health, including eating and exercise habits. Heart (coronary) risk increases with abnormal blood fat (lipid) levels. Heart-healthy meal planning includes limiting unhealthy fats, increasing healthy fats, and making other small dietary changes. This includes maintaining a healthy body weight to help keep lipid levels within a normal range. What is my plan? Your health care provider recommends that you:  Get no more than _25___% of the total calories in your daily diet from fat.  Limit your intake of saturated fat to less than ___5___% of your total calories each day.  Limit the amount of cholesterol in your diet to less than ___300___ mg per day.  What types of fat should I choose?  Choose healthy fats more often. Choose monounsaturated and polyunsaturated fats, such as olive oil and canola oil, flaxseeds, walnuts, almonds, and seeds.  Eat more omega-3 fats. Good choices include salmon, mackerel, sardines, tuna, flaxseed oil, and ground flaxseeds. Aim to eat fish at least two times each week.  Limit saturated fats. Saturated fats are primarily found in animal products, such as meats, butter, and cream. Plant sources of saturated fats include palm oil, palm kernel oil, and coconut oil.  Avoid foods with partially hydrogenated oils in them. These contain trans fats. Examples of foods that contain trans fats are stick margarine, some tub margarines, cookies, crackers, and other baked goods. What general guidelines do I need to follow?  Check food labels carefully to identify foods with trans fats or high amounts of saturated fat.  Fill one half of your plate with vegetables and green salads. Eat 4-5 servings of vegetables per day. A serving of vegetables equals 1 cup of raw leafy vegetables,  cup of raw or cooked cut-up vegetables, or  cup of vegetable juice.  Fill one fourth of your plate with whole grains. Look for the word  "whole" as the first word in the ingredient list.  Fill one fourth of your plate with lean protein foods.  Eat 4-5 servings of fruit per day. A serving of fruit equals one medium whole fruit,  cup of dried fruit,  cup of fresh, frozen, or canned fruit, or  cup of 100% fruit juice.  Eat more foods that contain soluble fiber. Examples of foods that contain this type of fiber are apples, broccoli, carrots, beans, peas, and barley. Aim to get 20-30 g of fiber per day.  Eat more home-cooked food and less restaurant, buffet, and fast food.  Limit or avoid alcohol.  Limit foods that are high in starch and sugar.  Avoid fried foods.  Cook foods by using methods other than frying. Baking, boiling, grilling, and broiling are all great options. Other fat-reducing suggestions include: ? Removing the skin from poultry. ? Removing all visible fats from meats. ? Skimming the fat off of stews, soups, and gravies before serving them. ? Steaming vegetables in water or broth.  Lose weight if you are overweight. Losing just 5-10% of your initial body weight can help your overall health and prevent diseases such as diabetes and heart disease.  Increase your consumption of nuts, legumes, and seeds to 4-5 servings per week. One serving of dried beans or legumes equals  cup after being cooked, one serving of nuts equals 1 ounces, and one serving of seeds equals  ounce or 1 tablespoon.  You may need to monitor your salt (sodium) intake, especially if you have high blood pressure. Talk with your health care provider or dietitian to get  more information about reducing sodium. What foods can I eat? Grains  Breads, including [place], [place], pita, wheat, raisin, rye, oatmeal, and [place]. Tortillas that are neither fried nor made with lard or trans fat. Low-fat rolls, including hotdog and hamburger buns and English muffins. Biscuits. Muffins. Waffles. Pancakes. Light popcorn. Whole-grain cereals. Flatbread.  Melba toast. Pretzels. Breadsticks. Rusks. Low-fat snacks and crackers, including oyster, saltine, matzo, graham, animal, and rye. Rice and pasta, including brown rice and those that are made with whole wheat. Vegetables All vegetables. Fruits All fruits, but limit coconut. Meats and Other Protein Sources Lean, well-trimmed beef, veal, pork, and lamb. Chicken and [place] without skin. All fish and shellfish. Wild duck, rabbit, pheasant, and venison. Egg whites or low-cholesterol egg substitutes. Dried beans, peas, lentils, and tofu.Seeds and most nuts. Dairy Low-fat or nonfat cheeses, including ricotta, string, and mozzarella. Skim or 1% milk that is liquid, powdered, or evaporated. Buttermilk that is made with low-fat milk. Nonfat or low-fat yogurt. Beverages Mineral water. Diet carbonated beverages. Sweets and Desserts Sherbets and fruit ices. Honey, jam, marmalade, jelly, and syrups. Meringues and gelatins. Pure sugar candy, such as hard candy, jelly beans, gumdrops, mints, marshmallows, and small amounts of dark chocolate. [employer] Eat all sweets and desserts in moderation. Fats and Oils Nonhydrogenated (trans-free) margarines. Vegetable oils, including soybean, sesame, sunflower, olive, peanut, safflower, corn, canola, and cottonseed. Salad dressings or mayonnaise that are made with a vegetable oil. Limit added fats and oils that you use for cooking, baking, salads, and as spreads. Other Cocoa powder. Coffee and tea. All seasonings and condiments. The items listed above may not be a complete list of recommended foods or beverages. Contact your dietitian for more options. What foods are not recommended? Grains Breads that are made with saturated or trans fats, oils, or whole milk. Croissants. Butter rolls. Cheese breads. Sweet rolls. Donuts. Buttered popcorn. Chow mein noodles. High-fat crackers, such as cheese or butter crackers. Meats and Other Protein Sources Fatty meats, such  as hotdogs, short ribs, sausage, spareribs, bacon, ribeye roast or steak, and mutton. High-fat deli meats, such as salami and bologna. Caviar. Domestic duck and goose. Organ meats, such as kidney, liver, sweetbreads, brains, gizzard, chitterlings, and heart. Dairy Cream, sour cream, cream cheese, and creamed cottage cheese. Whole milk cheeses, including blue (bleu), [place], [place], [place], [place], [place], [place], [place], [place], and [place]. Whole or 2% milk that is liquid, evaporated, or condensed. Whole buttermilk. Cream sauce or high-fat cheese sauce. Yogurt that is made from whole milk. Beverages Regular sodas and drinks with added sugar. Sweets and Desserts Frosting. Pudding. Cookies. Cakes other than angel food cake. Candy that has milk chocolate or [place] chocolate, hydrogenated fat, butter, coconut, or unknown ingredients. Buttered syrups. Full-fat ice cream or ice cream drinks. Fats and Oils Gravy that has suet, meat fat, or shortening. Cocoa butter, hydrogenated oils, palm oil, coconut oil, palm kernel oil. These can often be found in baked products, candy, fried foods, nondairy creamers, and whipped toppings. Solid fats and shortenings, including bacon fat, salt pork, lard, and butter. Nondairy cream substitutes, such as coffee creamers and sour cream substitutes. Salad dressings that are made of unknown oils, cheese, or sour cream. The items listed above may not be a complete list of foods and beverages to avoid. Contact your dietitian for more information. This information is not intended to replace advice given to you by your health care provider. Make sure you discuss any questions you have with your health care  provider. Document Released: 04/09/2008 Document Revised: 01/19/2016 Document Reviewed: 12/23/2013 Elsevier Interactive Patient Education  2018 [employer].   Increase water intake, strive for at least 90 ounces/day.   To reduce your LDL (bad cholesterol) and  increase your HDL (good cholesterol)- Follow Heart Healthy diet Increase regular exercise.  Recommend at least 30 minutes daily, 5 days per week of walking, jogging, biking, swimming, YouTube/Pinterest workout videos. Follow-up in 6 months for complete physical with fasting labs [place]! NICE TO SEE YOU!

## 2017-08-04 NOTE — Progress Notes (Signed)
Subjective:    Patient ID: Gregory Bernard, male    DOB: 01-13-1971, 47 y.o.   MRN: [id number]  HPI:  01/20/17 OV: Gregory Bernard is here to establish as a new pt.  He is a pleasant 47 year old male.  PMH:  HL and diffuse rash on back that has been present for years.  Significant family hx of diabetes-mother and siblings.  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  He denies tobacco/EOTH use.  He reports drinking > 80 ounces water/daily, however also consumes a substantial amount of sweet tea.  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 He has not had regular, routine medical care in years, with only healthcare at annual [facility] for fire department. He denies regular rx medication. One acute complaint- productive cough that developed two weeks ago.  He took 5 days of friend Amoxicillin and sx's improved, however he has lingering non-productive cough with sore throat (3/10).  He denies fever/night sweats/N/V/D.  1/21/9 OV: Gregory Bernard is here to review lresult from [facility] annual PHA. EKG-SR with SA PFT-WNL Audiogram-stable Lipid panel- Tot 226 HDL 35 TGs 163 LDL 160 ASCVD 3.1%, opt 1.1%- he does not have hx of HTN or tobacco use. He is receiving promotion at work tomorrow and his schedule will normalize in FEb 2019- which will afford him much more time for regular exercise.   Patient Care Team    Relationship Specialty Notifications Start End  Esaw Grandchild, NP PCP - General Family Medicine  01/20/17     Patient Active Problem List   Diagnosis Date Noted  . Elevated LDL cholesterol level 08/04/2017  . Rash of back 01/20/2017  . Family history of type II diabetes mellitus 01/20/2017  . Contact dermatitis 01/20/2017  . Pharyngitis 01/20/2017  . Healthcare maintenance 01/20/2017     Past Medical History:  Diagnosis Date  . Diverticulitis      Past Surgical History:  Procedure  Laterality Date  . FRACTURE SURGERY    . VASECTOMY       Family History  Problem Relation Age of Onset  . Heart attack Mother   . Diabetes Mother   . Hiatal hernia Father   . Dementia Father   . Diabetes Brother      Social History   Substance and Sexual Activity  Drug Use No     Social History   Substance and Sexual Activity  Alcohol Use No     Social History   Tobacco Use  Smoking Status Never Smoker  Smokeless Tobacco Never Used     Outpatient Encounter Medications as of 08/04/2017  Medication Sig  . ibuprofen (ADVIL,MOTRIN) 200 MG tablet Take 400 mg by mouth every 6 (six) hours as needed (pain).  Marland Kitchen ketoconazole (NIZORAL) 2 % shampoo Apply to hair.  Let sit in hair for 3-5 minutes before rinsing.  . [DISCONTINUED] Dextromethorphan-Guaifenesin ([place] DM PO) Take by mouth.  . [DISCONTINUED] fexofenadine (ALLEGRA) 30 MG tablet Take 30 mg by mouth as needed.  . [DISCONTINUED] predniSONE (DELTASONE) 20 MG tablet Take 1 tablet (20 mg total) by mouth daily with breakfast. 3 tabs day one. 2 tabs days two-five   No facility-administered encounter medications on file as of 08/04/2017.     Allergies: Patient has no known allergies.  Body mass index is 28.41 kg/m.  Blood pressure 106/70, pulse 71, height 5\' 7"  (1.702 m), weight 181 lb  6.4 oz (82.3 kg), SpO2 98 %.     Review of Systems  Constitutional: Positive for fatigue. Negative for activity change, appetite change, chills, diaphoresis, fever and unexpected weight change.  Eyes: Negative for visual disturbance.  Respiratory: Negative for cough, chest tightness, shortness of breath, wheezing and stridor.   Cardiovascular: Negative for chest pain, palpitations and leg swelling.  Endocrine: Negative for cold intolerance, heat intolerance, polydipsia, polyphagia and polyuria.  Genitourinary: Negative for difficulty urinating, flank pain and hematuria.  Musculoskeletal: Negative for arthralgias, back pain, gait  problem, joint swelling, myalgias, neck pain and neck stiffness.  Allergic/Immunologic: Negative for immunocompromised state.  Neurological: Negative for dizziness and headaches.  Hematological: Does not bruise/bleed easily.       Objective:   Physical Exam  Constitutional: He is oriented to person, place, and time. He appears well-developed and well-nourished. No distress.  HENT:  Head: Normocephalic and atraumatic.  Right Ear: Hearing, tympanic membrane, external ear and ear canal normal.  Left Ear: Hearing, tympanic membrane, external ear and ear canal normal.  Eyes: Conjunctivae are normal. Pupils are equal, round, and reactive to light.  Neck: Normal range of motion. Neck supple.  Cardiovascular: Normal rate, regular rhythm, normal heart sounds and intact distal pulses.  No murmur heard. Pulmonary/Chest: Effort normal and breath sounds normal. No respiratory distress. He has no wheezes. He has no rales. He exhibits no tenderness.  Lymphadenopathy:    He has no cervical adenopathy.  Neurological: He is alert and oriented to person, place, and time. Coordination normal.  Skin: Skin is warm and dry. No rash noted. He is not diaphoretic. No erythema. No pallor.  Psychiatric: He has a normal mood and affect. His behavior is normal. Judgment and thought content normal.  Nursing note and vitals reviewed.         Assessment & Plan:   1. Healthcare maintenance   2. Elevated LDL cholesterol level     Healthcare maintenance Increase water intake, strive for at least 90 ounces/day.   To reduce your LDL (bad cholesterol) and increase your HDL (good cholesterol)- Follow Heart Healthy diet Increase regular exercise.  Recommend at least 30 minutes daily, 5 days per week of walking, jogging, biking, swimming, YouTube/Pinterest workout videos. Follow-up in 6 months for complete physical with fasting labs [place]!    FOLLOW-UP:  Return in about 6 months (around  02/01/2018) for Fasting Labs, CPE.

## 2017-10-30 ENCOUNTER — Ambulatory Visit: Payer: Self-pay | Admitting: Podiatry

## 2017-10-30 ENCOUNTER — Other Ambulatory Visit: Payer: Self-pay | Admitting: Orthotics

## 2018-01-27 ENCOUNTER — Other Ambulatory Visit (INDEPENDENT_AMBULATORY_CARE_PROVIDER_SITE_OTHER): Payer: 59

## 2018-01-27 DIAGNOSIS — Z0000 Encounter for general adult medical examination without abnormal findings: Secondary | ICD-10-CM

## 2018-01-27 DIAGNOSIS — E7800 Pure hypercholesterolemia, unspecified: Secondary | ICD-10-CM

## 2018-01-27 DIAGNOSIS — Z833 Family history of diabetes mellitus: Secondary | ICD-10-CM

## 2018-01-28 LAB — COMPREHENSIVE METABOLIC PANEL
A/G RATIO: 2 (ref 1.2–2.2)
ALT: 42 IU/L (ref 0–44)
AST: 29 IU/L (ref 0–40)
Albumin: 4.5 g/dL (ref 3.5–5.5)
Alkaline Phosphatase: 72 IU/L (ref 39–117)
BUN/Creatinine Ratio: 13 (ref 9–20)
BUN: 14 mg/dL (ref 6–24)
Bilirubin Total: 1.1 mg/dL (ref 0.0–1.2)
CO2: 17 mmol/L — ABNORMAL LOW (ref 20–29)
Calcium: 9.4 mg/dL (ref 8.7–10.2)
Chloride: 106 mmol/L (ref 96–106)
Creatinine, Ser: 1.09 mg/dL (ref 0.76–1.27)
GFR calc Af Amer: 93 mL/min/{1.73_m2} (ref >59)
GFR, EST NON AFRICAN AMERICAN: 80 mL/min/{1.73_m2} (ref >59)
Globulin, Total: 2.3 g/dL (ref 1.5–4.5)
Glucose: 121 mg/dL — ABNORMAL HIGH (ref 65–99)
POTASSIUM: 5.2 mmol/L (ref 3.5–5.2)
Sodium: 141 mmol/L (ref 134–144)
Total Protein: 6.8 g/dL (ref 6.0–8.5)

## 2018-01-28 LAB — CBC WITH DIFFERENTIAL/PLATELET
BASOS: 1 %
Basophils Absolute: 0 10*3/uL (ref 0.0–0.2)
EOS (ABSOLUTE): 0.1 10*3/uL (ref 0.0–0.4)
Eos: 3 %
Hematocrit: 47 % (ref 37.5–51.0)
Hemoglobin: 16.5 g/dL (ref 13.0–17.7)
IMMATURE GRANS (ABS): 0 10*3/uL (ref 0.0–0.1)
Immature Granulocytes: 0 %
Lymphocytes Absolute: 2.2 10*3/uL (ref 0.7–3.1)
Lymphs: 44 %
MCH: 32.2 pg (ref 26.6–33.0)
MCHC: 35.1 g/dL (ref 31.5–35.7)
MCV: 92 fL (ref 79–97)
Monocytes Absolute: 0.4 10*3/uL (ref 0.1–0.9)
Monocytes: 8 %
Neutrophils Absolute: 2.2 10*3/uL (ref 1.4–7.0)
Neutrophils: 44 %
Platelets: 262 10*3/uL (ref 150–450)
RBC: 5.13 x10E6/uL (ref 4.14–5.80)
RDW: 12.9 % (ref 12.3–15.4)
WBC: 5 10*3/uL (ref 3.4–10.8)

## 2018-01-28 LAB — LIPID PANEL
Chol/HDL Ratio: 8.4 ratio — ABNORMAL HIGH (ref 0.0–5.0)
Cholesterol, Total: 227 mg/dL — ABNORMAL HIGH (ref 100–199)
HDL: 27 mg/dL — ABNORMAL LOW (ref >39)
LDL Calculated: 163 mg/dL — ABNORMAL HIGH (ref 0–99)
Triglycerides: 183 mg/dL — ABNORMAL HIGH (ref 0–149)
VLDL CHOLESTEROL CAL: 37 mg/dL (ref 5–40)

## 2018-01-28 LAB — VITAMIN D 25 HYDROXY (VIT D DEFICIENCY, FRACTURES): Vit D, 25-Hydroxy: 31.2 ng/mL (ref 30.0–100.0)

## 2018-01-28 LAB — TSH: TSH: 2.02 u[IU]/mL (ref 0.450–4.500)

## 2018-01-28 LAB — HEMOGLOBIN A1C
ESTIMATED AVERAGE GLUCOSE: 131 mg/dL
Hgb A1c MFr Bld: 6.2 % — ABNORMAL HIGH (ref 4.8–5.6)

## 2018-01-29 NOTE — Progress Notes (Signed)
Subjective:    Patient ID: Gregory Bernard, male    DOB: 1970-09-07, 47 y.o.   MRN: [id number]  HPI:  01/20/17 OV: Gregory Bernard is here to establish as a new pt.  He is a pleasant 47 year old male.  PMH:  HL and diffuse rash on back that has been present for years.  Significant family hx of diabetes-mother and siblings.  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  He denies tobacco/EOTH use.  He reports drinking > 80 ounces water/daily, however also consumes a substantial amount of sweet tea.  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 He has not had regular, routine medical care in years, with only healthcare at annual [facility] for fire department. He denies regular rx medication. One acute complaint- productive cough that developed two weeks ago.  He took 5 days of friend Amoxicillin and sx's improved, however he has lingering non-productive cough with sore throat (3/10).  He denies fever/night sweats/N/V/D.  1/21/9 OV: Gregory Bernard is here to review lresult from [facility] annual PHA. EKG-SR with SA PFT-WNL Audiogram-stable Lipid panel- Tot 226 HDL 35 TGs 163 LDL 160 ASCVD 3.1%, opt 1.1%- he does not have hx of HTN or tobacco use. He is receiving promotion at work tomorrow and his schedule will normalize in FEb 2019- which will afford him much more time for regular exercise.  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 He denies tobacco/ETOH use He denies CP/dyspnea/palpitations/HA/dizziness Reviewed recent labs- The 10-year ASCVD  risk score Mikey Bussing DC Brooke Bonito., et al., 2013) is: 3.8%   Values used to calculate the score:     Age: 70 years     Sex: Male     Is Non-Hispanic African American: No     Diabetic: No     Tobacco smoker: No     Systolic Blood Pressure: 96 mmHg     Is BP treated: No     HDL Cholesterol: 27 mg/dL     Total Cholesterol: 227 mg/dL  LDL-163  Lab Results  Component Value Date   HGBA1C 6.2 (H) 01/27/2018   HGBA1C 5.9 01/20/2017   Healthcare Maintenance: Colonoscopy-Not indicated Immunizations-UTD  Patient Care Team    Relationship Specialty Notifications Start End  Esaw Grandchild, NP PCP - General Family Medicine  01/20/17     Patient Active Problem List   Diagnosis Date Noted  . Pre-diabetes 02/02/2018  . Snoring 02/02/2018  . Tinea corporis 02/02/2018  . Elevated LDL cholesterol level 08/04/2017  . Rash of back 01/20/2017  . Family history of type II diabetes mellitus 01/20/2017  . Contact dermatitis 01/20/2017  . Pharyngitis 01/20/2017  . Healthcare maintenance 01/20/2017     Past Medical History:  Diagnosis Date  . Diverticulitis      Past Surgical History:  Procedure Laterality Date  . FRACTURE SURGERY    . VASECTOMY       Family History  Problem Relation Age of Onset  . Heart attack Mother   . Diabetes Mother   . Hiatal hernia Father   . Dementia  Father   . Diabetes Brother      Social History   Substance and Sexual Activity  Drug Use No     Social History   Substance and Sexual Activity  Alcohol Use No     Social History   Tobacco Use  Smoking Status Never Smoker  Smokeless Tobacco Never Used     Outpatient Encounter Medications as of 02/02/2018  Medication Sig  . ibuprofen (ADVIL,MOTRIN) 200 MG tablet Take 400 mg by mouth every 6 (six) hours as needed (pain).  Marland Kitchen ketoconazole (NIZORAL) 2 % shampoo Apply topically 2 (two) times a week. Apply to hair.  Let sit in hair for 3-5 minutes before rinsing.  . [DISCONTINUED] ketoconazole (NIZORAL)  2 % shampoo Apply to hair.  Let sit in hair for 3-5 minutes before rinsing.  . fluconazole (DIFLUCAN) 100 MG tablet 1 tablet by mouth every other day for three doses   No facility-administered encounter medications on file as of 02/02/2018.     Allergies: Patient has no known allergies.  Body mass index is 28.71 kg/m.  Blood pressure 96/63, pulse 64, height 5\' 7"  (1.702 m), weight 183 lb 4.8 oz (83.1 kg), SpO2 97 %.  Review of Systems  Constitutional: Positive for fatigue. Negative for activity change, appetite change, chills, diaphoresis, fever and unexpected weight change.  Eyes: Negative for visual disturbance.  Respiratory: Negative for cough, chest tightness, shortness of breath, wheezing and stridor.   Cardiovascular: Negative for chest pain, palpitations and leg swelling.  Endocrine: Negative for cold intolerance, heat intolerance, polydipsia, polyphagia and polyuria.  Genitourinary: Negative for difficulty urinating, flank pain and hematuria.  Musculoskeletal: Negative for arthralgias, back pain, gait problem, joint swelling, myalgias, neck pain and neck stiffness.  Allergic/Immunologic: Negative for immunocompromised state.  Neurological: Negative for dizziness and headaches.  Hematological: Does not bruise/bleed easily.       Objective:   Physical Exam  Constitutional: He is oriented to person, place, and time. He appears well-developed and well-nourished. No distress.  HENT:  Head: Normocephalic and atraumatic.  Right Ear: Hearing, tympanic membrane, external ear and ear canal normal. Tympanic membrane is not erythematous and not bulging. No decreased hearing is noted.  Left Ear: External ear and ear canal normal. Tympanic membrane is not erythematous and not bulging. No decreased hearing is noted.  Nose: No mucosal edema or rhinorrhea. Right sinus exhibits no maxillary sinus tenderness and no frontal sinus tenderness. Left sinus exhibits no maxillary sinus tenderness and  no frontal sinus tenderness.  Mouth/Throat: Uvula is midline, oropharynx is clear and moist and mucous membranes are normal.  Eyes: Pupils are equal, round, and reactive to light. Conjunctivae are normal.  Neck: Normal range of motion. Neck supple.  Cardiovascular: Normal rate, regular rhythm, normal heart sounds and intact distal pulses.  No murmur heard. Pulmonary/Chest: Effort normal and breath sounds normal. No respiratory distress. He has no wheezes. He has no rales. He exhibits no tenderness.  Abdominal: Soft. Bowel sounds are normal. He exhibits no distension and no mass. There is no tenderness. There is no rebound and no guarding.  Genitourinary:  Genitourinary Comments: Declined DRE/genital exam  Musculoskeletal: Normal range of motion.  Lymphadenopathy:    He has no cervical adenopathy.  Neurological: He is alert and oriented to person, place, and time. Coordination normal.  Skin: Skin is warm and dry. No rash noted. He is not diaphoretic. No erythema. No pallor.     Circular patches of lightened skin over posterior thoracic back No  open tissue/drainge/streaking noted  Psychiatric: He has a normal mood and affect. His behavior is normal. Judgment and thought content normal.  Nursing note and vitals reviewed.     Assessment & Plan:   1. Snoring   2. Fatigue, unspecified type   3. Healthcare maintenance   4. Elevated LDL cholesterol level   5. Pre-diabetes   6. Tinea corporis     Elevated LDL cholesterol level The 10-year ASCVD risk score Mikey Bussing DC Jr., et al., 2013) is: 3.8%   Values used to calculate the score:     Age: 42 years     Sex: Male     Is Non-Hispanic African American: No     Diabetic: No     Tobacco smoker: No     Systolic Blood Pressure: 96 mmHg     Is BP treated: No     HDL Cholesterol: 27 mg/dL     Total Cholesterol: 227 mg/dL  LDL- 163 Follow Mediterranean Diet Increase regular exercise Will recheck in 8 months  Pre-diabetes Lab Results    Component Value Date   HGBA1C 6.2 (H) 01/27/2018   HGBA1C 5.9 01/20/2017   Reduce CHO/sugar Increase regular exercise  Healthcare maintenance Increase water intake, strive for at least 100 ounces/day.   Follow Mediterranean diet Increase regular exercise.  Recommend at least 30 minutes daily, 5 days per week of walking, jogging, biking, swimming, YouTube/Pinterest workout videos. You an normalize cholesterol and blood sugar with lifestyle change! Referral to Pulmonology, re: Sleep Study Refill on Ketoconazole shampoo and oral tablets Return in 6-8 months for lab appt, re: fasting labs Return in 1 year for annual physical.  Snoring Referral to Pulmonology place for Sleep Study  Tinea corporis Ketoconazole shampoo 2% and Diflucan 100mg  every other day for three doses Remove sweaty/moist clothing ASAP    FOLLOW-UP:  Return in about 1 year (around 02/03/2019) for CPE.

## 2018-02-02 ENCOUNTER — Encounter: Payer: Self-pay | Admitting: Adult Health

## 2018-02-02 ENCOUNTER — Ambulatory Visit (INDEPENDENT_AMBULATORY_CARE_PROVIDER_SITE_OTHER): Payer: 59 | Admitting: Adult Health

## 2018-02-02 VITALS — BP 96/63 | HR 64 | Ht 67.0 in | Wt 183.3 lb

## 2018-02-02 DIAGNOSIS — R0683 Snoring: Secondary | ICD-10-CM | POA: Diagnosis not present

## 2018-02-02 DIAGNOSIS — Z0000 Encounter for general adult medical examination without abnormal findings: Secondary | ICD-10-CM

## 2018-02-02 DIAGNOSIS — B354 Tinea corporis: Secondary | ICD-10-CM | POA: Insufficient documentation

## 2018-02-02 DIAGNOSIS — E7800 Pure hypercholesterolemia, unspecified: Secondary | ICD-10-CM | POA: Diagnosis not present

## 2018-02-02 DIAGNOSIS — R7303 Prediabetes: Secondary | ICD-10-CM

## 2018-02-02 DIAGNOSIS — R5383 Other fatigue: Secondary | ICD-10-CM | POA: Diagnosis not present

## 2018-02-02 MED ORDER — KETOCONAZOLE 2 % EX SHAM: MEDICATED_SHAMPOO | CUTANEOUS | 3 refills | Status: DC

## 2018-02-02 MED ORDER — FLUCONAZOLE 100 MG PO TABS: ORAL_TABLET | ORAL | 0 refills | Status: DC

## 2018-02-02 NOTE — Assessment & Plan Note (Addendum)
Ketoconazole shampoo 2% and Diflucan 100mg  every other day for three doses Remove sweaty/moist clothing ASAP

## 2018-02-02 NOTE — Assessment & Plan Note (Signed)
Referral to Pulmonology place for Sleep Study

## 2018-02-02 NOTE — Patient Instructions (Signed)
Preventive Care for Adults, Male A healthy lifestyle and preventive care can promote health and wellness. Preventive health guidelines for men include the following key practices:  A routine yearly physical is a good way to check with your health care provider about your health and preventative screening. It is a chance to share any concerns and updates on your health and to receive a thorough exam.  Visit your dentist for a routine exam and preventative care every 6 months. Brush your teeth twice a day and floss once a day. Good oral hygiene prevents tooth decay and gum disease.  The frequency of eye exams is based on your age, health, family medical history, use of contact lenses, and other factors. Follow your health care provider's recommendations for frequency of eye exams.  Eat a healthy diet. Foods such as vegetables, fruits, whole grains, low-fat dairy products, and lean protein foods contain the nutrients you need without too many calories. Decrease your intake of foods high in solid fats, added sugars, and salt. Eat the right amount of calories for you.Get information about a proper diet from your health care provider, if necessary.  Regular physical exercise is one of the most important things you can do for your health. Most adults should get at least 150 minutes of moderate-intensity exercise (any activity that increases your heart rate and causes you to sweat) each week. In addition, most adults need muscle-strengthening exercises on 2 or more days a week.  Maintain a healthy weight. The body mass index (BMI) is a screening tool to identify possible weight problems. It provides an estimate of body fat based on height and weight. Your health care provider can find your BMI and can help you achieve or maintain a healthy weight.For adults 20 years and older:  A BMI below 18.5 is considered underweight.  A BMI of 18.5 to 24.9 is normal.  A BMI of 25 to 29.9 is considered  overweight.  A BMI of 30 and above is considered obese.  Maintain normal blood lipids and cholesterol levels by exercising and minimizing your intake of saturated fat. Eat a balanced diet with plenty of fruit and vegetables. Blood tests for lipids and cholesterol should begin at age 55 and be repeated every 5 years. If your lipid or cholesterol levels are high, you are over 50, or you are at high risk for heart disease, you may need your cholesterol levels checked more frequently.Ongoing high lipid and cholesterol levels should be treated with medicines if diet and exercise are not working.  If you smoke, find out from your health care provider how to quit. If you do not use tobacco, do not start.  Lung cancer screening is recommended for adults aged 90-80 years who are at high risk for developing lung cancer because of a history of smoking. A yearly low-dose CT scan of the lungs is recommended for people who have at least a 30-pack-year history of smoking and are a current smoker or have quit within the past 15 years. A pack year of smoking is smoking an average of 1 pack of cigarettes a day for 1 year (for example: 1 pack a day for 30 years or 2 packs a day for 15 years). Yearly screening should continue until the smoker has stopped smoking for at least 15 years. Yearly screening should be stopped for people who develop a health problem that would prevent them from having lung cancer treatment.  If you choose to drink alcohol, do not have more  than 2 drinks per day. One drink is considered to be 12 ounces (355 mL) of beer, 5 ounces (148 mL) of wine, or 1.5 ounces (44 mL) of liquor.  Avoid use of street drugs. Do not share needles with anyone. Ask for help if you need support or instructions about stopping the use of drugs.  High blood pressure causes heart disease and increases the risk of stroke. Your blood pressure should be checked at least every 1-2 years. Ongoing high blood pressure should be  treated with medicines, if weight loss and exercise are not effective.  If you are 34-90 years old, ask your health care provider if you should take aspirin to prevent heart disease.  Diabetes screening is done by taking a blood sample to check your blood glucose level after you have not eaten for a certain period of time (fasting). If you are not overweight and you do not have risk factors for diabetes, you should be screened once every 3 years starting at age 35. If you are overweight or obese and you are 70-84 years of age, you should be screened for diabetes every year as part of your cardiovascular risk assessment.  Colorectal cancer can be detected and often prevented. Most routine colorectal cancer screening begins at the age of 18 and continues through age 69. However, your health care provider may recommend screening at an earlier age if you have risk factors for colon cancer. On a yearly basis, your health care provider may provide home test kits to check for hidden blood in the stool. Use of a small camera at the end of a tube to directly examine the colon (sigmoidoscopy or colonoscopy) can detect the earliest forms of colorectal cancer. Talk to your health care provider about this at age 71, when routine screening begins. Direct exam of the colon should be repeated every 5-10 years through age 18, unless early forms of precancerous polyps or small growths are found.  People who are at an increased risk for hepatitis B should be screened for this virus. You are considered at high risk for hepatitis B if:  You were born in a country where hepatitis B occurs often. Talk with your health care provider about which countries are considered high risk.  Your parents were born in a high-risk country and you have not received a shot to protect against hepatitis B (hepatitis B vaccine).  You have HIV or AIDS.  You use needles to inject street drugs.  You live with, or have sex with, someone who  has hepatitis B.  You are a man who has sex with other men (MSM).  You get hemodialysis treatment.  You take certain medicines for conditions such as cancer, organ transplantation, and autoimmune conditions.  Hepatitis C blood testing is recommended for all people born from [birth year] through 1965 and any individual with known risks for hepatitis C.  Practice safe sex. Use condoms and avoid high-risk sexual practices to reduce the spread of sexually transmitted infections (STIs). STIs include gonorrhea, chlamydia, syphilis, trichomonas, herpes, HPV, and human immunodeficiency virus (HIV). Herpes, HIV, and HPV are viral illnesses that have no cure. They can result in disability, cancer, and death.  If you are a man who has sex with other men, you should be screened at least once per year for:  HIV.  Urethral, rectal, and pharyngeal infection of gonorrhea, chlamydia, or both.  If you are at risk of being infected with HIV, it is recommended that you take a  prescription medicine daily to prevent HIV infection. This is called preexposure prophylaxis (PrEP). You are considered at risk if:  You are a man who has sex with other men (MSM) and have other risk factors.  You are a heterosexual man, are sexually active, and are at increased risk for HIV infection.  You take drugs by injection.  You are sexually active with a partner who has HIV.  Talk with your health care provider about whether you are at high risk of being infected with HIV. If you choose to begin PrEP, you should first be tested for HIV. You should then be tested every 3 months for as long as you are taking PrEP.  A one-time screening for abdominal aortic aneurysm (AAA) and surgical repair of large AAAs by ultrasound are recommended for men ages 44 to 66 years who are current or former smokers.  Healthy men should no longer receive prostate-specific antigen (PSA) blood tests as part of routine cancer screening. Talk with your health  care provider about prostate cancer screening.  Testicular cancer screening is not recommended for adult males who have no symptoms. Screening includes self-exam, a health care provider exam, and other screening tests. Consult with your health care provider about any symptoms you have or any concerns you have about testicular cancer.  Use sunscreen. Apply sunscreen liberally and repeatedly throughout the day. You should seek shade when your shadow is shorter than you. Protect yourself by wearing long sleeves, pants, a wide-brimmed hat, and sunglasses year round, whenever you are outdoors.  Once a month, do a whole-body skin exam, using a mirror to look at the skin on your back. Tell your health care provider about new moles, moles that have irregular borders, moles that are larger than a pencil eraser, or moles that have changed in shape or color.  Stay current with required vaccines (immunizations).  Influenza vaccine. All adults should be immunized every year.  Tetanus, diphtheria, and acellular pertussis (Td, Tdap) vaccine. An adult who has not previously received Tdap or who does not know his vaccine status should receive 1 dose of Tdap. This initial dose should be followed by tetanus and diphtheria toxoids (Td) booster doses every 10 years. Adults with an unknown or incomplete history of completing a 3-dose immunization series with Td-containing vaccines should begin or complete a primary immunization series including a Tdap dose. Adults should receive a Td booster every 10 years.  Varicella vaccine. An adult without evidence of immunity to varicella should receive 2 doses or a second dose if he has previously received 1 dose.  Human papillomavirus (HPV) vaccine. Males aged 11-21 years who have not received the vaccine previously should receive the 3-dose series. Males aged 22-26 years may be immunized. Immunization is recommended through the age of 23 years for any male who has sex with males  and did not get any or all doses earlier. Immunization is recommended for any person with an immunocompromised condition through the age of 72 years if he did not get any or all doses earlier. During the 3-dose series, the second dose should be obtained 4-8 weeks after the first dose. The third dose should be obtained 24 weeks after the first dose and 16 weeks after the second dose.  Zoster vaccine. One dose is recommended for adults aged 23 years or older unless certain conditions are present.  Measles, mumps, and rubella (MMR) vaccine. Adults born before [birth year] generally are considered immune to measles and mumps. Adults born in [birth year]  or later should have 1 or more doses of MMR vaccine unless there is a contraindication to the vaccine or there is laboratory evidence of immunity to each of the three diseases. A routine second dose of MMR vaccine should be obtained at least 28 days after the first dose for students attending postsecondary schools, health care workers, or international travelers. People who received inactivated measles vaccine or an unknown type of measles vaccine during 1963-1967 should receive 2 doses of MMR vaccine. People who received inactivated mumps vaccine or an unknown type of mumps vaccine before 1979 and are at high risk for mumps infection should consider immunization with 2 doses of MMR vaccine. Unvaccinated health care workers born before [birth year] who lack laboratory evidence of measles, mumps, or rubella immunity or laboratory confirmation of disease should consider measles and mumps immunization with 2 doses of MMR vaccine or rubella immunization with 1 dose of MMR vaccine.  Pneumococcal 13-valent conjugate (PCV13) vaccine. When indicated, a person who is uncertain of his immunization history and has no record of immunization should receive the PCV13 vaccine. All adults 9 years of age and older should receive this vaccine. An adult aged 69 years or older who has certain medical  conditions and has not been previously immunized should receive 1 dose of PCV13 vaccine. This PCV13 should be followed with a dose of pneumococcal polysaccharide (PPSV23) vaccine. Adults who are at high risk for pneumococcal disease should obtain the PPSV23 vaccine at least 8 weeks after the dose of PCV13 vaccine. Adults older than 48 years of age who have normal immune system function should obtain the PPSV23 vaccine dose at least 1 year after the dose of PCV13 vaccine.  Pneumococcal polysaccharide (PPSV23) vaccine. When PCV13 is also indicated, PCV13 should be obtained first. All adults aged 79 years and older should be immunized. An adult younger than age 43 years who has certain medical conditions should be immunized. Any person who resides in a nursing home or long-term care facility should be immunized. An adult smoker should be immunized. People with an immunocompromised condition and certain other conditions should receive both PCV13 and PPSV23 vaccines. People with human immunodeficiency virus (HIV) infection should be immunized as soon as possible after diagnosis. Immunization during chemotherapy or radiation therapy should be avoided. Routine use of PPSV23 vaccine is not recommended for American Indians, [place] Natives, or people younger than 65 years unless there are medical conditions that require PPSV23 vaccine. When indicated, people who have unknown immunization and have no record of immunization should receive PPSV23 vaccine. One-time revaccination 5 years after the first dose of PPSV23 is recommended for people aged 19-64 years who have chronic kidney failure, nephrotic syndrome, asplenia, or immunocompromised conditions. People who received 1-2 doses of PPSV23 before age 70 years should receive another dose of PPSV23 vaccine at age 79 years or later if at least 5 years have passed since the previous dose. Doses of PPSV23 are not needed for people immunized with PPSV23 at or after age 55  years.  Meningococcal vaccine. Adults with asplenia or persistent complement component deficiencies should receive 2 doses of quadrivalent meningococcal conjugate (MenACWY-D) vaccine. The doses should be obtained at least 2 months apart. Microbiologists working with certain meningococcal bacteria, [employer] recruits, people at risk during an outbreak, and people who travel to or live in countries with a high rate of meningitis should be immunized. A first-year college student up through age 64 years who is living in a residence hall should receive a  dose if he did not receive a dose on or after his 16th birthday. Adults who have certain high-risk conditions should receive one or more doses of vaccine.  Hepatitis A vaccine. Adults who wish to be protected from this disease, have chronic liver disease, work with hepatitis A-infected animals, work in hepatitis A research labs, or travel to or work in countries with a high rate of hepatitis A should be immunized. Adults who were previously unvaccinated and who anticipate close contact with an international adoptee during the first 60 days after arrival in the [place] from a country with a high rate of hepatitis A should be immunized.  Hepatitis B vaccine. Adults should be immunized if they wish to be protected from this disease, are under age 34 years and have diabetes, have chronic liver disease, have had more than one sex partner in the past 6 months, may be exposed to blood or other infectious body fluids, are household contacts or sex partners of hepatitis B positive people, are clients or workers in certain care facilities, or travel to or work in countries with a high rate of hepatitis B.  Haemophilus influenzae type b (Hib) vaccine. A previously unvaccinated person with asplenia or sickle cell disease or having a scheduled splenectomy should receive 1 dose of Hib vaccine. Regardless of previous immunization, a recipient of a hematopoietic stem cell  transplant should receive a 3-dose series 6-12 months after his successful transplant. Hib vaccine is not recommended for adults with HIV infection. Preventive Service / Frequency Ages 77 to 55  Blood pressure check.** / Every 3-5 years.  Lipid and cholesterol check.** / Every 5 years beginning at age 66.  Hepatitis C blood test.** / For any individual with known risks for hepatitis C.  Skin self-exam. / Monthly.  Influenza vaccine. / Every year.  Tetanus, diphtheria, and acellular pertussis (Tdap, Td) vaccine.** / Consult your health care provider. 1 dose of Td every 10 years.  Varicella vaccine.** / Consult your health care provider.  HPV vaccine. / 3 doses over 6 months, if 45 or younger.  Measles, mumps, rubella (MMR) vaccine.** / You need at least 1 dose of MMR if you were born in 1957 or later. You may also need a second dose.  Pneumococcal 13-valent conjugate (PCV13) vaccine.** / Consult your health care provider.  Pneumococcal polysaccharide (PPSV23) vaccine.** / 1 to 2 doses if you smoke cigarettes or if you have certain conditions.  Meningococcal vaccine.** / 1 dose if you are age 81 to 79 years and a Market researcher living in a residence hall, or have one of several medical conditions. You may also need additional booster doses.  Hepatitis A vaccine.** / Consult your health care provider.  Hepatitis B vaccine.** / Consult your health care provider.  Haemophilus influenzae type b (Hib) vaccine.** / Consult your health care provider. Ages 6 to 58  Blood pressure check.** / Every year.  Lipid and cholesterol check.** / Every 5 years beginning at age 89.  Lung cancer screening. / Every year if you are aged 84-80 years and have a 30-pack-year history of smoking and currently smoke or have quit within the past 15 years. Yearly screening is stopped once you have quit smoking for at least 15 years or develop a health problem that would prevent you from having  lung cancer treatment.  Fecal occult blood test (FOBT) of stool. / Every year beginning at age 90 and continuing until age 73. You may not have to do  this test if you get a colonoscopy every 10 years.  Flexible sigmoidoscopy** or colonoscopy.** / Every 5 years for a flexible sigmoidoscopy or every 10 years for a colonoscopy beginning at age 50 and continuing until age 75.  Hepatitis C blood test.** / For all people born from 1945 through 1965 and any individual with known risks for hepatitis C.  Skin self-exam. / Monthly.  Influenza vaccine. / Every year.  Tetanus, diphtheria, and acellular pertussis (Tdap/Td) vaccine.** / Consult your health care provider. 1 dose of Td every 10 years.  Varicella vaccine.** / Consult your health care provider.  Zoster vaccine.** / 1 dose for adults aged 60 years or older.  Measles, mumps, rubella (MMR) vaccine.** / You need at least 1 dose of MMR if you were born in 1957 or later. You may also need a second dose.  Pneumococcal 13-valent conjugate (PCV13) vaccine.** / Consult your health care provider.  Pneumococcal polysaccharide (PPSV23) vaccine.** / 1 to 2 doses if you smoke cigarettes or if you have certain conditions.  Meningococcal vaccine.** / Consult your health care provider.  Hepatitis A vaccine.** / Consult your health care provider.  Hepatitis B vaccine.** / Consult your health care provider.  Haemophilus influenzae type b (Hib) vaccine.** / Consult your health care provider. Ages 65 and over  Blood pressure check.** / Every year.  Lipid and cholesterol check.**/ Every 5 years beginning at age 20.  Lung cancer screening. / Every year if you are aged 55-80 years and have a 30-pack-year history of smoking and currently smoke or have quit within the past 15 years. Yearly screening is stopped once you have quit smoking for at least 15 years or develop a health problem that would prevent you from having lung cancer treatment.  Fecal  occult blood test (FOBT) of stool. / Every year beginning at age 50 and continuing until age 75. You may not have to do this test if you get a colonoscopy every 10 years.  Flexible sigmoidoscopy** or colonoscopy.** / Every 5 years for a flexible sigmoidoscopy or every 10 years for a colonoscopy beginning at age 50 and continuing until age 75.  Hepatitis C blood test.** / For all people born from 1945 through 1965 and any individual with known risks for hepatitis C.  Abdominal aortic aneurysm (AAA) screening.** / A one-time screening for ages 65 to 75 years who are current or former smokers.  Skin self-exam. / Monthly.  Influenza vaccine. / Every year.  Tetanus, diphtheria, and acellular pertussis (Tdap/Td) vaccine.** / 1 dose of Td every 10 years.  Varicella vaccine.** / Consult your health care provider.  Zoster vaccine.** / 1 dose for adults aged 60 years or older.  Pneumococcal 13-valent conjugate (PCV13) vaccine.** / 1 dose for all adults aged 65 years and older.  Pneumococcal polysaccharide (PPSV23) vaccine.** / 1 dose for all adults aged 65 years and older.  Meningococcal vaccine.** / Consult your health care provider.  Hepatitis A vaccine.** / Consult your health care provider.  Hepatitis B vaccine.** / Consult your health care provider.  Haemophilus influenzae type b (Hib) vaccine.** / Consult your health care provider. **Family history and personal history of risk and conditions may change your health care provider's recommendations.   This information is not intended to replace advice given to you by your health care provider. Make sure you discuss any questions you have with your health care provider.   Document Released: 08/27/2001 Document Revised: 07/22/2014 Document Reviewed: 11/26/2010 Elsevier Interactive Patient Education 2016   [place] refers to food and lifestyle choices that are based on the traditions of  countries located on the [employer]. This way of eating has been shown to help prevent certain conditions and improve outcomes for people who have chronic diseases, like kidney disease and heart disease. What are tips for following this plan? Lifestyle  Cook and eat meals together with your family, when possible.  Drink enough fluid to keep your urine clear or pale yellow.  Be physically active every day. This includes: ? Aerobic exercise like running or swimming. ? Leisure activities like gardening, walking, or housework.  Get 7-8 hours of sleep each night.  If recommended by your health care provider, drink red wine in moderation. This means 1 glass a day for nonpregnant women and 2 glasses a day for men. A glass of wine equals 5 oz (150 mL). Reading food labels  Check the serving size of packaged foods. For foods such as rice and pasta, the serving size refers to the amount of cooked product, not dry.  Check the total fat in packaged foods. Avoid foods that have saturated fat or trans fats.  Check the ingredients list for added sugars, such as corn syrup. Shopping  At the grocery store, buy most of your food from the areas near the walls of the store. This includes: ? Fresh fruits and vegetables (produce). ? Grains, beans, nuts, and seeds. Some of these may be available in unpackaged forms or large amounts (in bulk). ? Fresh seafood. ? Poultry and eggs. ? Low-fat dairy products.  Buy whole ingredients instead of prepackaged foods.  Buy fresh fruits and vegetables in-season from local farmers markets.  Buy frozen fruits and vegetables in resealable bags.  If you do not have access to quality fresh seafood, buy precooked frozen shrimp or canned fish, such as tuna, salmon, or sardines.  Buy small amounts of raw or cooked vegetables, salads, or olives from the deli or salad bar at your store.  Stock your pantry so you always have certain foods on hand, such as olive  oil, canned tuna, canned tomatoes, rice, pasta, and beans. Cooking  Cook foods with extra-virgin olive oil instead of using butter or other vegetable oils.  Have meat as a side dish, and have vegetables or grains as your main dish. This means having meat in small portions or adding small amounts of meat to foods like pasta or stew.  Use beans or vegetables instead of meat in common dishes like chili or lasagna.  Experiment with different cooking methods. Try roasting or broiling vegetables instead of steaming or sauteing them.  Add frozen vegetables to soups, stews, pasta, or rice.  Add nuts or seeds for added healthy fat at each meal. You can add these to yogurt, salads, or vegetable dishes.  Marinate fish or vegetables using olive oil, lemon juice, garlic, and fresh herbs. Meal planning  Plan to eat 1 vegetarian meal one day each week. Try to work up to 2 vegetarian meals, if possible.  Eat seafood 2 or more times a week.  Have healthy snacks readily available, such as: ? Vegetable sticks with hummus. ? [place] yogurt. ? Fruit and nut trail mix.  Eat balanced meals throughout the week. This includes: ? Fruit: 2-3 servings a day ? Vegetables: 4-5 servings a day ? Low-fat dairy: 2 servings a day ? Fish, poultry, or lean meat: 1 serving a day ? Beans and legumes: 2 or more  servings a week ? Nuts and seeds: 1-2 servings a day ? Whole grains: 6-8 servings a day ? Extra-virgin olive oil: 3-4 servings a day  Limit red meat and sweets to only a few servings a month What are my food choices?  Mediterranean diet ? Recommended ? Grains: Whole-grain pasta. Brown rice. Bulgar wheat. Polenta. Couscous. Whole-wheat bread. [place]. ? Vegetables: Artichokes. Beets. Broccoli. Cabbage. Carrots. Eggplant. Green beans. Chard. Kale. Spinach. Onions. Leeks. Peas. Squash. Tomatoes. Peppers. Radishes. ? Fruits: Apples. Apricots. Avocado. Berries. Bananas. Cherries. Dates. Figs. Grapes.  Lemons. Melon. Oranges. Peaches. Plums. Pomegranate. ? Meats and other protein foods: Beans. Almonds. Sunflower seeds. Pine nuts. Peanuts. [place]. Salmon. Scallops. Shrimp. [place]. Tilapia. Clams. Oysters. Eggs. ? Dairy: Low-fat milk. Cheese. Greek yogurt. ? Beverages: Water. Red wine. Herbal tea. ? Fats and oils: Extra virgin olive oil. Avocado oil. Grape seed oil. ? Sweets and desserts: [place] yogurt with honey. Baked apples. Poached pears. Trail mix. ? Seasoning and other foods: Basil. Cilantro. Coriander. Cumin. Mint. Parsley. Sage. Rosemary. Tarragon. Garlic. Oregano. Thyme. Pepper. Balsalmic vinegar. Tahini. Hummus. Tomato sauce. Olives. Mushrooms. ? Limit these ? Grains: Prepackaged pasta or rice dishes. Prepackaged cereal with added sugar. ? Vegetables: Deep fried potatoes (french fries). ? Fruits: Fruit canned in syrup. ? Meats and other protein foods: Beef. Pork. Lamb. Poultry with skin. Hot dogs. [place] Salines. ? Dairy: Ice cream. Sour cream. Whole milk. ? Beverages: Juice. Sugar-sweetened soft drinks. Beer. Liquor and spirits. ? Fats and oils: Butter. Canola oil. Vegetable oil. Beef fat (tallow). Lard. ? Sweets and desserts: Cookies. Cakes. Pies. Candy. ? Seasoning and other foods: Mayonnaise. Premade sauces and marinades. ? The items listed may not be a complete list. Talk with your dietitian about what dietary choices are right for you. Summary  The Mediterranean diet includes both food and lifestyle choices.  Eat a variety of fresh fruits and vegetables, beans, nuts, seeds, and whole grains.  Limit the amount of red meat and sweets that you eat.  Talk with your health care provider about whether it is safe for you to drink red wine in moderation. This means 1 glass a day for nonpregnant women and 2 glasses a day for men. A glass of wine equals 5 oz (150 mL). This information is not intended to replace advice given to you by your health care provider. Make sure you discuss any questions  you have with your health care provider. Document Released: 02/22/2016 Document Revised: 03/26/2016 Document Reviewed: 02/22/2016 Elsevier Interactive Patient Education  2018 [employer].   Increase water intake, strive for at least 100 ounces/day.   Follow Mediterranean diet Increase regular exercise.  Recommend at least 30 minutes daily, 5 days per week of walking, jogging, biking, swimming, YouTube/Pinterest workout videos. You an normalize cholesterol and blood sugar with lifestyle change! Referral to Pulmonology, re: Sleep Study Refill on Ketoconazole shampoo and oral tablets Return in 6-8 months for lab appt, re: fasting labs Return in 1 year for annual physical. NICE TO SEE YOU!

## 2018-02-02 NOTE — Assessment & Plan Note (Signed)
The 10-year ASCVD risk score Mikey Bussing DC Brooke Bonito., et al., 2013) is: 3.8%   Values used to calculate the score:     Age: 47 years     Sex: Male     Is Non-Hispanic African American: No     Diabetic: No     Tobacco smoker: No     Systolic Blood Pressure: 96 mmHg     Is BP treated: No     HDL Cholesterol: 27 mg/dL     Total Cholesterol: 227 mg/dL  LDL- 163 Follow Mediterranean Diet Increase regular exercise Will recheck in 8 months

## 2018-02-02 NOTE — Assessment & Plan Note (Signed)
Lab Results  Component Value Date   HGBA1C 6.2 (H) 01/27/2018   HGBA1C 5.9 01/20/2017   Reduce CHO/sugar Increase regular exercise

## 2018-02-02 NOTE — Assessment & Plan Note (Signed)
Increase water intake, strive for at least 100 ounces/day.   Follow Mediterranean diet Increase regular exercise.  Recommend at least 30 minutes daily, 5 days per week of walking, jogging, biking, swimming, YouTube/Pinterest workout videos. You an normalize cholesterol and blood sugar with lifestyle change! Referral to Pulmonology, re: Sleep Study Refill on Ketoconazole shampoo and oral tablets Return in 6-8 months for lab appt, re: fasting labs Return in 1 year for annual physical.

## 2018-03-13 ENCOUNTER — Institutional Professional Consult (permissible substitution): Payer: 59 | Admitting: Pulmonary Disease

## 2018-06-15 ENCOUNTER — Institutional Professional Consult (permissible substitution): Payer: 59 | Admitting: Pulmonary Disease

## 2018-12-28 ENCOUNTER — Telehealth: Payer: Self-pay | Admitting: Adult Health

## 2018-12-28 NOTE — Telephone Encounter (Signed)
Patient's wife called states pt gets seasonal rash on back & is requesting refill on medication given pt last year for same Rash (that comes and goes thru the summer)  -- Forwarding request to medical assistant for review & if approved send refill order to :   [id number] - [place], [place] [phone] (Phone) [phone] (Fax)   --Please call wife @ [phone] if questions..  -glh

## 2018-12-28 NOTE — Progress Notes (Signed)
Subjective:    Patient ID: Gregory Bernard, male    DOB: October 15, 1970, 48 y.o.   MRN: [id number]  HPI:  Mr. Ketchum presents with rash that covers his entire back that developed >2 weeks ago. He reports sustaining sunburn 1 week ago that worsened the rash. He reports diffuse itching, denies pain/oozing/fever/night sweats/N/V/D He reports this occurring las year. He was evaluated by Dermatology 2018- dx'd with Tinea Versicolor  He runs a lawn care company and if often in sweaty clothes for long periods-discussed that he needs to remove sweaty/saturated clothing frequently  Patient Care Team    Relationship Specialty Notifications Start End  Danford, Berna Spare, NP PCP - General Family Medicine  01/20/17     Patient Active Problem List   Diagnosis Date Noted  . 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 Pre-diabetes 02/02/2018  . Snoring 02/02/2018  . Tinea corporis 02/02/2018  . Elevated LDL cholesterol level 08/04/2017  . Rash of back 01/20/2017  . Family history of type II diabetes mellitus 01/20/2017  . Contact dermatitis 01/20/2017  . Pharyngitis 01/20/2017  . Healthcare maintenance 01/20/2017     Past Medical History:  Diagnosis Date  . Diverticulitis      Past Surgical History:  Procedure Laterality Date  . FRACTURE SURGERY    . VASECTOMY       Family History  Problem Relation Age of Onset  . Heart attack Mother   . Diabetes Mother   . Hiatal hernia Father   . Dementia Father   . Diabetes Brother      Social History   Substance and Sexual Activity  Drug Use No     Social History   Substance and Sexual Activity  Alcohol Use No     Social History   Tobacco Use  Smoking Status Never Smoker  Smokeless Tobacco Never Used     Outpatient Encounter Medications as of 12/29/2018  Medication Sig  . ibuprofen (ADVIL,MOTRIN) 200 MG tablet Take 400 mg by mouth every 6 (six) hours as needed (pain).  . [DISCONTINUED] ketoconazole (NIZORAL) 2 % shampoo Apply topically 2  (two) times a week. Apply to hair.  Let sit in hair for 3-5 minutes before rinsing.  . fluconazole (DIFLUCAN) 200 MG tablet Take 1 tablet by mouth every 2 weeks.  Marland Kitchen ketoconazole (NIZORAL) 2 % shampoo Use on scalp once weekly.  . [DISCONTINUED] fluconazole (DIFLUCAN) 100 MG tablet 1 tablet by mouth every other day for three doses   No facility-administered encounter medications on file as of 12/29/2018.     Allergies: Patient has no known allergies.  Body mass index is 29.66 kg/m.  Blood pressure 116/70, pulse 72, temperature 97.9 F (36.6 C), temperature source Oral, height 5\' 7"  (1.702 m), weight 189 lb 6.4 oz (85.9 kg), SpO2 98 %.  Review of Systems  Constitutional: Positive for fatigue. Negative for activity change, appetite change, chills, diaphoresis, fever and unexpected weight change.  Eyes: Negative for visual disturbance.  Respiratory: Negative for cough, chest tightness and shortness of breath.   Cardiovascular: Negative for chest pain, palpitations and leg swelling.  Endocrine: Negative for cold intolerance, heat intolerance, polydipsia, polyphagia and polyuria.  Skin: Positive for color change and rash. Negative for pallor and wound.  Neurological: Negative for dizziness and headaches.  Hematological: Negative for adenopathy. Does not bruise/bleed easily.       Objective:   Physical Exam Vitals signs and nursing note reviewed.  Constitutional:      General: He is not in acute  distress.    Appearance: Normal appearance. He is normal weight. He is not ill-appearing, toxic-appearing or diaphoretic.  Skin:    General: Skin is warm and dry.     Capillary Refill: Capillary refill takes less than 2 seconds.     Findings: Erythema and rash present. Rash is not crusting.          Comments: Tinea Veriscolor expands all over his back. No open tissue/draingage/streaking noted.  Neurological:     Mental Status: He is alert and oriented to person, place, and time.   Psychiatric:        Mood and Affect: Mood normal.        Behavior: Behavior normal.        Thought Content: Thought content normal.        Judgment: Judgment normal.       Assessment & Plan:   1. Tinea versicolor   2. Healthcare maintenance     Tinea versicolor Please take Diflucan 200mg  one tablet by mouth- every 2 weeks for 3 doses. Please use Ketoconazole 2% shampoo once weekly. Discard that hair gel that causes tinea to worsen. Recommend you change out of sweaty clothing frequently.   Healthcare maintenance Continue to social distance and wear a mask when in public.    FOLLOW-UP:  Return if symptoms worsen or fail to improve.

## 2018-12-28 NOTE — Telephone Encounter (Signed)
Pt's spouse informed that pt will need in office visit to evaluate and treat.  Spouse given appt for 12/29/2018 @9am .  Charyl Bigger, CMA

## 2018-12-29 ENCOUNTER — Encounter: Payer: Self-pay | Admitting: Adult Health

## 2018-12-29 ENCOUNTER — Ambulatory Visit: Payer: 59 | Admitting: Adult Health

## 2018-12-29 ENCOUNTER — Other Ambulatory Visit: Payer: Self-pay

## 2018-12-29 DIAGNOSIS — B360 Pityriasis versicolor: Secondary | ICD-10-CM | POA: Diagnosis not present

## 2018-12-29 DIAGNOSIS — Z0000 Encounter for general adult medical examination without abnormal findings: Secondary | ICD-10-CM | POA: Diagnosis not present

## 2018-12-29 MED ORDER — FLUCONAZOLE 200 MG PO TABS: ORAL_TABLET | ORAL | 0 refills | Status: DC

## 2018-12-29 MED ORDER — KETOCONAZOLE 2 % EX SHAM: MEDICATED_SHAMPOO | CUTANEOUS | 1 refills | Status: DC

## 2018-12-29 NOTE — Assessment & Plan Note (Signed)
Continue to social distance and wear a mask when in public 

## 2018-12-29 NOTE — Patient Instructions (Addendum)
Tinea Versicolor  Tinea versicolor is a common fungal infection of the skin. It causes a rash that appears as light or dark patches on the skin. The rash most often occurs on the chest, back, neck, or upper arms. This condition is more common during warm weather. Other than affecting how your skin looks, tinea versicolor usually does not cause other problems. In most cases, the infection goes away in a few weeks with treatment. It may take a few months for the patches on your skin to return to your usual skin color. What are the causes? This condition occurs when a type of fungus that is normally present on the skin starts to overgrow. This fungus is a kind of yeast. The exact cause of the overgrowth is not known. This condition cannot be passed from one person to another (it is not contagious). What increases the risk? This condition is more likely to develop when certain factors are present, such as:  Heat and humidity.  Sweating too much.  Hormone changes.  Oily skin.  A weak disease-fighting system (immunesystem). What are the signs or symptoms? Symptoms of this condition include:  A rash of light or dark patches on your skin. The rash may have: ? Patches of tan or pink spots (on light skin). ? Patches of [place] or brown spots (on dark skin). ? Patches of skin that do not tan. ? Well-marked edges. ? Scales on the discolored areas.  Mild itching. How is this diagnosed? A health care provider can usually diagnose this condition by looking at your skin. During the exam, he or she may use ultraviolet (UV) light to see how much of your skin has been affected. In some cases, a skin sample may be taken by scraping the rash. This sample will be viewed under a microscope to check for yeast overgrowth. How is this treated? Treatment for this condition may include:  Dandruff shampoo that is applied to the affected skin during showers or bathing.  Over-the-counter medicated skin cream,  lotion, or soaps.  Prescription antifungal medicine in the form of skin cream or pills.  Medicine to help reduce itching. Follow these instructions at home:  Take over-the-counter and prescription medicines only as told by your health care provider.  Apply dandruff shampoo to the affected area if your health care provider told you to do that. You may be instructed to scrub the affected skin for several minutes each day.  Do not scratch the affected area of skin.  Avoid hot and humid conditions.  Do not use tanning booths.  Try to avoid sweating a lot. Contact a health care provider if:  Your symptoms get worse.  You have a fever.  You have redness, swelling, or pain at the site of your rash.  You have fluid or blood coming from your rash.  Your rash feels warm to the touch.  You have pus or a bad smell coming from your rash.  Your rash returns (recurs) after treatment. Summary  Tinea versicolor is a common fungal infection of the skin. It causes a rash that appears as light or dark patches on the skin.  The rash most often occurs on the chest, back, neck, or upper arms.  A health care provider can usually diagnose this condition by looking at your skin.  Treatment may include applying shampoo to the skin and taking or applying medicines. This information is not intended to replace advice given to you by your health care provider. Make sure you  discuss any questions you have with your health care provider. Document Released: 06/28/2000 Document Revised: 03/04/2017 Document Reviewed: 03/04/2017 Elsevier Interactive Patient Education  2019 [place].  Please take Diflucan 200mg  one tablet by mouth- every 2 weeks for 3 doses. Please use Ketoconazole 2% shampoo once weekly. Discard that hair gel that causes tinea to worsen. Recommend you change out of sweaty clothing frequently. Continue to social distance and wear a mask when in public. FEEL BETTER!

## 2018-12-29 NOTE — Assessment & Plan Note (Signed)
Please take Diflucan 200mg  one tablet by mouth- every 2 weeks for 3 doses. Please use Ketoconazole 2% shampoo once weekly. Discard that hair gel that causes tinea to worsen. Recommend you change out of sweaty clothing frequently.

## 2019-04-28 ENCOUNTER — Telehealth: Payer: Self-pay | Admitting: Adult Health

## 2019-04-28 NOTE — Telephone Encounter (Signed)
Pt's wife called states his COVID test came back last night POSITIVE & his symptoms are worsening--Cough/ breathing with body aches, they wish advice & Rx for his condition.   --Forwarding message to medical asst to contact pt @ [phone].  --glh

## 2019-04-28 NOTE — Telephone Encounter (Signed)
Per Mina Marble, pt advised that he should be seen at the ED for evaluation and supportive care.  Pt declined.  Charyl Bigger, CMA

## 2020-01-20 ENCOUNTER — Ambulatory Visit: Payer: 59 | Admitting: Dermatology

## 2020-01-20 ENCOUNTER — Other Ambulatory Visit: Payer: Self-pay

## 2020-01-20 DIAGNOSIS — B359 Dermatophytosis, unspecified: Secondary | ICD-10-CM

## 2020-01-20 MED ORDER — TERBINAFINE HCL 250 MG PO TABS: 250.0000 mg | ORAL_TABLET | Freq: Every day | ORAL | 1 refills | Status: DC

## 2020-01-20 NOTE — Patient Instructions (Addendum)
Go to [facility] and have labs drawn, then again in 6 weeks.  Stay Well Follow-up for Gregory Bernard date of birth Dec 08, 1970.  He previously was diagnosed with extensive tinea versicolor on his back which required a brief course annually of oral antifungal.  Now the rash is distinctly more marginated papulosquamous with large patches on his back that are KOH positive.  Additionally there is definitely fungus on both great toenails so this fits tinea corporis rather than tinea versicolor.  He will take oral terbinafine 1 daily for 6 weeks after obtaining baseline laboratories.  I will recheck him in 6 weeks and if we choose to continue the therapy to try to get his toenails clear we will recheck his lab.  Should he develop abdominal pain, darkening of his urine, or lightening of his stool he will discontinue the medication and call me.  He knows he can contact me if there are any issues.

## 2020-01-21 LAB — CBC WITH DIFFERENTIAL/PLATELET
Absolute Monocytes: 567 cells/uL (ref 200–950)
Basophils Absolute: 42 cells/uL (ref 0–200)
Basophils Relative: 0.6 %
Eosinophils Absolute: 315 cells/uL (ref 15–500)
Eosinophils Relative: 4.5 %
HCT: 47.7 % (ref 38.5–50.0)
Hemoglobin: 15.7 g/dL (ref 13.2–17.1)
Lymphs Abs: 2667 cells/uL (ref 850–3900)
MCH: 32 pg (ref 27.0–33.0)
MCHC: 32.9 g/dL (ref 32.0–36.0)
MCV: 97.1 fL (ref 80.0–100.0)
MPV: 10 fL (ref 7.5–12.5)
Monocytes Relative: 8.1 %
Neutro Abs: 3409 cells/uL (ref 1500–7800)
Neutrophils Relative %: 48.7 %
Platelets: 257 10*3/uL (ref 140–400)
RBC: 4.91 10*6/uL (ref 4.20–5.80)
RDW: 11.5 % (ref 11.0–15.0)
Total Lymphocyte: 38.1 %
WBC: 7 10*3/uL (ref 3.8–10.8)

## 2020-01-21 LAB — COMPREHENSIVE METABOLIC PANEL
AG Ratio: 1.9 (calc) (ref 1.0–2.5)
ALT: 33 U/L (ref 9–46)
AST: 17 U/L (ref 10–40)
Albumin: 4.3 g/dL (ref 3.6–5.1)
Alkaline phosphatase (APISO): 77 U/L (ref 36–130)
BUN: 18 mg/dL (ref 7–25)
CO2: 23 mmol/L (ref 20–32)
Calcium: 9.7 mg/dL (ref 8.6–10.3)
Chloride: 106 mmol/L (ref 98–110)
Creat: 1.06 mg/dL (ref 0.60–1.35)
Globulin: 2.3 g/dL (calc) (ref 1.9–3.7)
Glucose, Bld: 175 mg/dL — ABNORMAL HIGH (ref 65–139)
Potassium: 4.4 mmol/L (ref 3.5–5.3)
Sodium: 139 mmol/L (ref 135–146)
Total Bilirubin: 1.1 mg/dL (ref 0.2–1.2)
Total Protein: 6.6 g/dL (ref 6.1–8.1)

## 2020-01-24 ENCOUNTER — Telehealth: Payer: Self-pay

## 2020-01-24 NOTE — Telephone Encounter (Signed)
Phone call to patient with his lab results and Dr. Tafeen's recommendations. Patient aware.  

## 2020-01-24 NOTE — Telephone Encounter (Signed)
-----   Message from Lavonna Monarch, MD sent at 01/22/2020  3:34 AM EDT ----- Ok to start terbinafine

## 2020-01-27 ENCOUNTER — Other Ambulatory Visit: Payer: Self-pay | Admitting: Gastroenterology

## 2020-01-27 DIAGNOSIS — R1311 Dysphagia, oral phase: Secondary | ICD-10-CM

## 2020-02-02 ENCOUNTER — Ambulatory Visit
Admission: RE | Admit: 2020-02-02 | Discharge: 2020-02-02 | Disposition: A | Discharge status: Home / Self Care | Payer: 59 | Source: Ambulatory Visit | Attending: Gastroenterology | Admitting: Gastroenterology

## 2020-02-02 DIAGNOSIS — R1311 Dysphagia, oral phase: Secondary | ICD-10-CM

## 2020-02-13 ENCOUNTER — Encounter: Payer: Self-pay | Admitting: Dermatology

## 2020-02-13 NOTE — Progress Notes (Signed)
   Follow-Up Visit   Subjective  Gregory Bernard is a 49 y.o. male who presents for the following: Rash (on back).  Rash Location: Back Duration: Several months Quality:  Associated Signs/Symptoms: Itch Modifying Factors:  Severity:  Timing: Context: Past diagnosis tinea versicolor treated with oral antifungals.  Objective  Well appearing patient in no apparent distress; mood and affect are within normal limits.  All skin waist up examined.  Plus feet/toenails/fingernails.  Assessment & Plan  Follow-up for Gregory Bernard date of birth 24-Nov-1970.  He previously was diagnosed with extensive tinea versicolor on his back which required a brief course annually of oral antifungal.  Now the rash is distinctly more marginated papulosquamous with large patches on his back that are KOH positive.  Additionally there is definitely fungus on both great toenails so this fits tinea corporis rather than tinea versicolor.  He will take oral terbinafine 1 daily for 6 weeks after obtaining baseline laboratories.  I will recheck him in 6 weeks and if we choose to continue the therapy to try to get his toenails clear we will recheck his lab.  Should he develop abdominal pain, darkening of his urine, or lightening of his stool he will discontinue the medication and call me.  He knows he can contact me if there are any issues.   Tinea Right Upper Back  Oral terbinafine, initially for 30 days.  Check labs to help determine safety of longer treatment for toenails.  terbinafine (LAMISIL) 250 MG tablet - Right Upper Back  CBC with Differential/Platelets - Right Upper Back  CMP - Right Upper Back  POCT Skin KOH - Right Upper Back  Culture, fungus without smear - Right Upper Back  Other Related Procedures CBC with Differential/Platelets CMP     I, Lavonna Monarch, MD, have reviewed all documentation for this visit.  The documentation on 02/13/20 for the exam, diagnosis, procedures, and orders  are all accurate and complete.

## 2020-02-23 LAB — CULTURE, FUNGUS WITHOUT SMEAR
CULTURE:: NO GROWTH
MICRO NUMBER:: 10692801
SPECIMEN QUALITY:: ADEQUATE

## 2020-03-09 ENCOUNTER — Telehealth: Payer: Self-pay | Admitting: *Deleted

## 2020-03-09 DIAGNOSIS — Z5181 Encounter for therapeutic drug level monitoring: Secondary | ICD-10-CM

## 2020-03-09 NOTE — Telephone Encounter (Signed)
Patient at lab to get lab done and they dont have the lab order- per ST sent over labs for Lamisil monitoring

## 2020-03-10 LAB — COMPREHENSIVE METABOLIC PANEL
AG Ratio: 1.8 (calc) (ref 1.0–2.5)
ALT: 32 U/L (ref 9–46)
AST: 18 U/L (ref 10–40)
Albumin: 4.8 g/dL (ref 3.6–5.1)
Alkaline phosphatase (APISO): 80 U/L (ref 36–130)
BUN: 16 mg/dL (ref 7–25)
CO2: 25 mmol/L (ref 20–32)
Calcium: 9.9 mg/dL (ref 8.6–10.3)
Chloride: 104 mmol/L (ref 98–110)
Creat: 1.05 mg/dL (ref 0.60–1.35)
Globulin: 2.6 g/dL (calc) (ref 1.9–3.7)
Glucose, Bld: 141 mg/dL — ABNORMAL HIGH (ref 65–99)
Potassium: 4.5 mmol/L (ref 3.5–5.3)
Sodium: 139 mmol/L (ref 135–146)
Total Bilirubin: 1.1 mg/dL (ref 0.2–1.2)
Total Protein: 7.4 g/dL (ref 6.1–8.1)

## 2020-03-10 LAB — CBC WITH DIFFERENTIAL/PLATELET
Absolute Monocytes: 698 cells/uL (ref 200–950)
Basophils Absolute: 47 cells/uL (ref 0–200)
Basophils Relative: 0.5 %
Eosinophils Absolute: 177 cells/uL (ref 15–500)
Eosinophils Relative: 1.9 %
HCT: 50.6 % — ABNORMAL HIGH (ref 38.5–50.0)
Hemoglobin: 17.3 g/dL — ABNORMAL HIGH (ref 13.2–17.1)
Lymphs Abs: 2576 cells/uL (ref 850–3900)
MCH: 32.4 pg (ref 27.0–33.0)
MCHC: 34.2 g/dL (ref 32.0–36.0)
MCV: 94.8 fL (ref 80.0–100.0)
MPV: 10 fL (ref 7.5–12.5)
Monocytes Relative: 7.5 %
Neutro Abs: 5803 cells/uL (ref 1500–7800)
Neutrophils Relative %: 62.4 %
Platelets: 289 10*3/uL (ref 140–400)
RBC: 5.34 10*6/uL (ref 4.20–5.80)
RDW: 11.3 % (ref 11.0–15.0)
Total Lymphocyte: 27.7 %
WBC: 9.3 10*3/uL (ref 3.8–10.8)

## 2020-03-15 ENCOUNTER — Ambulatory Visit: Payer: 59 | Admitting: Dermatology

## 2020-05-02 ENCOUNTER — Ambulatory Visit: Payer: 59 | Admitting: Dermatology

## 2020-05-02 ENCOUNTER — Encounter: Payer: Self-pay | Admitting: Dermatology

## 2020-05-02 ENCOUNTER — Other Ambulatory Visit: Payer: Self-pay

## 2020-05-02 DIAGNOSIS — L918 Other hypertrophic disorders of the skin: Secondary | ICD-10-CM

## 2020-05-02 DIAGNOSIS — B360 Pityriasis versicolor: Secondary | ICD-10-CM

## 2020-05-02 DIAGNOSIS — B359 Dermatophytosis, unspecified: Secondary | ICD-10-CM | POA: Diagnosis not present

## 2020-05-02 MED ORDER — TERBINAFINE HCL 250 MG PO TABS: 250.0000 mg | ORAL_TABLET | Freq: Every day | ORAL | 1 refills | Status: DC

## 2020-05-04 NOTE — Progress Notes (Signed)
   Follow-Up Visit   Subjective  Gregory Bernard is a 49 y.o. male who presents for the following: Follow-up (Patient here today for follow up Tinea, treatment Terbinafine.  Per patient he has had improvement.  No new concerns.).  Follow up Tinea Location:  Duration:  Quality:  Associated Signs/Symptoms: Modifying Factors:  Severity:  Timing: Context:   Objective  Well appearing patient in no apparent distress; mood and affect are within normal limits.  All skin waist up examined.   Assessment & Plan    Tinea versicolor Right Upper Back  Call if needed  Tinea  Skin tag Left Anterior Neck    Destruction of lesion - Left Anterior Neck Complexity: simple   Destruction method comment:  Scissors were used to snip tag at the base Informed consent: discussed and consent obtained   Timeout:  patient name, date of birth, surgical site, and procedure verified Anesthesia: the lesion was anesthetized in a standard fashion   Anesthetic:  1% lidocaine w/ epinephrine 1-100,000 local infiltration Hemostasis achieved with:  pressure and ferric subsulfate Outcome: patient tolerated procedure well with no complications   Post-procedure details: wound care instructions given       I, Lavonna Monarch, MD, have reviewed all documentation for this visit.  The documentation on 06/10/20 for the exam, diagnosis, procedures, and orders are all accurate and complete.

## 2020-06-10 ENCOUNTER — Encounter: Payer: Self-pay | Admitting: Dermatology

## 2020-07-14 ENCOUNTER — Ambulatory Visit
Admission: EM | Admit: 2020-07-14 | Discharge: 2020-07-14 | Disposition: A | Discharge status: Home / Self Care | Payer: 59 | Attending: Emergency Medicine | Admitting: Emergency Medicine

## 2020-07-14 ENCOUNTER — Other Ambulatory Visit: Payer: Self-pay

## 2020-07-14 DIAGNOSIS — N23 Unspecified renal colic: Secondary | ICD-10-CM

## 2020-07-14 LAB — POCT URINALYSIS DIP (MANUAL ENTRY)
Bilirubin, UA: NEGATIVE
Glucose, UA: 500 mg/dL — AB
Ketones, POC UA: NEGATIVE mg/dL
Leukocytes, UA: NEGATIVE
Nitrite, UA: NEGATIVE
Protein Ur, POC: NEGATIVE mg/dL
Spec Grav, UA: 1.02 (ref 1.010–1.025)
Urobilinogen, UA: 0.2 E.U./dL
pH, UA: 6 (ref 5.0–8.0)

## 2020-07-14 MED ORDER — KETOROLAC TROMETHAMINE 10 MG PO TABS: 10.0000 mg | ORAL_TABLET | Freq: Four times a day (QID) | ORAL | 0 refills | Status: DC | PRN

## 2020-07-14 MED ORDER — KETOROLAC TROMETHAMINE 30 MG/ML IJ SOLN
30.0000 mg | Freq: Once | INTRAMUSCULAR | Status: AC
  Administered 2020-07-14: 30 mg via INTRAMUSCULAR

## 2020-07-14 MED ORDER — ONDANSETRON 4 MG PO TBDP
4.0000 mg | ORAL_TABLET | Freq: Three times a day (TID) | ORAL | 0 refills | Status: DC | PRN
Start: 2020-07-14 — End: 2021-09-27

## 2020-07-14 NOTE — Discharge Instructions (Signed)
Drink plenty of fluids (water, sugar-free drinks) throughout the day. May take Toradol as needed for pain: No ibuprofen, Motrin, Advil, Aleve, naproxen (NSAIDs) while taking this. Zofran as needed for nausea. Use urine strainer when urinating.   If you catch a stone, please follow-up with your PCP or urology for further evaluation. Go to ER for blood in urine, worsening abdominal or back pain, vomiting, fever. 

## 2020-07-14 NOTE — ED Provider Notes (Signed)
[facility]    CSN: [id number] Arrival date & time: 07/14/20  1006      History   Chief Complaint Chief Complaint  Patient presents with  . Groin Injury  . Groin Pain  . Back Pain    HPI Gregory Bernard is a 49 y.o. male  With self-reported history of diverticulitis presenting for LLQ and left flank pain.  States that he is now in constant pain: Had sudden onset last night.  No dysuria, hematuria, penile discharge or testicular pain.  States pain starts in left back, radiates around to the front.  Denies history of kidney stone.  Past Medical History:  Diagnosis Date  . Diverticulitis     Patient Active Problem List   Diagnosis Date Noted  . 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 Pre-diabetes 02/02/2018  . Snoring 02/02/2018  . Tinea corporis 02/02/2018  . Elevated LDL cholesterol level 08/04/2017  . Rash of back 01/20/2017  . Family history of type II diabetes mellitus 01/20/2017  . Contact dermatitis 01/20/2017  . Pharyngitis 01/20/2017  . Healthcare maintenance 01/20/2017    Past Surgical History:  Procedure Laterality Date  . FRACTURE SURGERY    . VASECTOMY         Home Medications    Prior to Admission medications   Medication Sig Start Date End Date Taking? Authorizing Provider  ketorolac (TORADOL) 10 MG tablet Take 1 tablet (10 mg total) by mouth every 6 (six) hours as needed. 07/14/20  Yes Hall-Potvin, [place], PA-C  ondansetron (ZOFRAN ODT) 4 MG disintegrating tablet Take 1 tablet (4 mg total) by mouth every 8 (eight) hours as needed for nausea or vomiting. 07/14/20  Yes Hall-Potvin, [place], PA-C  ibuprofen (ADVIL,MOTRIN) 200 MG tablet Take 400 mg by mouth every 6 (six) hours as needed (pain).    [provider]  ketoconazole (NIZORAL) 2 % shampoo Use on scalp once weekly. 12/29/18   DanfordJinny Blossom, NP    Family History Family History  Problem Relation Age of Onset  . Heart attack Mother   . Diabetes Mother   . Hiatal hernia  Father   . Dementia Father   . Diabetes Brother     Social History Social History   Tobacco Use  . Smoking status: Never Smoker  . Smokeless tobacco: Never Used  Vaping Use  . Vaping Use: Never used  Substance Use Topics  . Alcohol use: No  . Drug use: No     Allergies   Patient has no known allergies.   Review of Systems Review of Systems  Constitutional: Negative for fatigue and fever.  Gastrointestinal: Positive for abdominal pain. Negative for nausea.  Genitourinary: Negative for dysuria, frequency, genital sores, penile discharge, penile pain, penile swelling, scrotal swelling, testicular pain and urgency.  Musculoskeletal: Positive for back pain. Negative for arthralgias and myalgias.  Skin: Negative for color change and rash.      Physical Exam Triage Vital Signs ED Triage Vitals  Enc Vitals Group     BP 07/14/20 1236 126/75     Pulse Rate 07/14/20 1236 80     Resp 07/14/20 1236 18     Temp 07/14/20 1236 98.9 F (37.2 C)     Temp Source 07/14/20 1236 Oral     SpO2 07/14/20 1236 97 %     Weight --      Height --      Head Circumference --      Peak Flow --  Pain Score 07/14/20 1237 5     Pain Loc --      Pain Edu? --      Excl. in [place]? --    No data found.  Updated Vital Signs BP 126/75 (BP Location: Left Arm)   Pulse 80   Temp 98.9 F (37.2 C) (Oral)   Resp 18   SpO2 97%   Visual Acuity Right Eye Distance:   Left Eye Distance:   Bilateral Distance:    Right Eye Near:   Left Eye Near:    Bilateral Near:     Physical Exam Constitutional:      General: He is not in acute distress. HENT:     Head: Normocephalic and atraumatic.  Eyes:     General: No scleral icterus.    Pupils: Pupils are equal, round, and reactive to light.  Cardiovascular:     Rate and Rhythm: Normal rate.  Pulmonary:     Effort: Pulmonary effort is normal. No respiratory distress.     Breath sounds: No wheezing.  Abdominal:     General: There is no  distension.     Palpations: Abdomen is soft.     Tenderness: There is abdominal tenderness. There is left CVA tenderness. There is no right CVA tenderness or guarding.     Comments: LLQ tenderness, severe L CVA TTP  Skin:    Coloration: Skin is not jaundiced or pale.  Neurological:     Mental Status: He is alert and oriented to person, place, and time.      UC Treatments / Results  Labs (all labs ordered are listed, but only abnormal results are displayed) Labs Reviewed  POCT URINALYSIS DIP (MANUAL ENTRY) - Abnormal; Notable for the following components:      Result Value   Glucose, UA =500 (*)    Blood, UA moderate (*)    All other components within normal limits    EKG   Radiology No results found.  Procedures Procedures (including critical care time)  Medications Ordered in UC Medications  ketorolac (TORADOL) 30 MG/ML injection 30 mg (30 mg Intramuscular Given 07/14/20 1425)    Initial Impression / Assessment and Plan / UC Course  I have reviewed the triage vital signs and the nursing notes.  Pertinent labs & imaging results that were available during my care of the patient were reviewed by me and considered in my medical decision making (see chart for details).     Patient febrile, nontoxic in office today.  Urine as above.  H&P suspicious for renal colic ? kidney stone.  Given Toradol in office which he tolerated well.  Will treat supportively, provided strainer, push fluids, follow-up with PCP.  ER return precautions discussed, pt verbalized understanding and is agreeable to plan. Final Clinical Impressions(s) / UC Diagnoses   Final diagnoses:  Renal colic on left side     Discharge Instructions     Drink plenty of fluids (water, sugar-free drinks) throughout the day. May take Toradol as needed for pain: No ibuprofen, Motrin, Advil, Aleve, naproxen (NSAIDs) while taking this. Zofran as needed for nausea. Use urine strainer when urinating.   If you catch  a stone, please follow-up with your PCP or urology for further evaluation. Go to ER for blood in urine, worsening abdominal or back pain, vomiting, fever.    ED Prescriptions    Medication Sig Dispense Auth. Provider   ondansetron (ZOFRAN ODT) 4 MG disintegrating tablet Take 1 tablet (4 mg total) by  mouth every 8 (eight) hours as needed for nausea or vomiting. 21 tablet Hall-Potvin, [place], PA-C   ketorolac (TORADOL) 10 MG tablet Take 1 tablet (10 mg total) by mouth every 6 (six) hours as needed. 20 tablet Hall-Potvin, [place], PA-C     PDMP not reviewed this encounter.   Neldon Mc [place], [place] 07/14/20 1437

## 2020-07-14 NOTE — ED Triage Notes (Signed)
Pt present left side groin pain. Pt states the pains start at his groin and works toward his back and hurts with every movement.

## 2020-07-16 ENCOUNTER — Encounter (HOSPITAL_BASED_OUTPATIENT_CLINIC_OR_DEPARTMENT_OTHER): Payer: Self-pay | Admitting: Emergency Medicine

## 2020-07-16 ENCOUNTER — Other Ambulatory Visit: Payer: Self-pay

## 2020-07-16 ENCOUNTER — Emergency Department (HOSPITAL_BASED_OUTPATIENT_CLINIC_OR_DEPARTMENT_OTHER): Payer: 59

## 2020-07-16 ENCOUNTER — Emergency Department (HOSPITAL_BASED_OUTPATIENT_CLINIC_OR_DEPARTMENT_OTHER)
Admission: EM | Admit: 2020-07-16 | Discharge: 2020-07-16 | Disposition: A | Discharge status: Home / Self Care | Payer: 59 | Attending: Emergency Medicine | Admitting: Emergency Medicine

## 2020-07-16 DIAGNOSIS — N50812 Left testicular pain: Secondary | ICD-10-CM | POA: Diagnosis not present

## 2020-07-16 DIAGNOSIS — R109 Unspecified abdominal pain: Secondary | ICD-10-CM | POA: Diagnosis not present

## 2020-07-16 DIAGNOSIS — N23 Unspecified renal colic: Secondary | ICD-10-CM

## 2020-07-16 LAB — URINALYSIS, MICROSCOPIC (REFLEX)

## 2020-07-16 LAB — URINALYSIS, ROUTINE W REFLEX MICROSCOPIC
Bilirubin Urine: NEGATIVE
Glucose, UA: >=500 mg/dL — AB
Ketones, ur: NEGATIVE mg/dL
Leukocytes,Ua: NEGATIVE
Nitrite: NEGATIVE
Protein, ur: NEGATIVE mg/dL
Specific Gravity, Urine: 1.015 (ref 1.005–1.030)
pH: 6 (ref 5.0–8.0)

## 2020-07-16 LAB — CBC
HCT: 44.7 % (ref 39.0–52.0)
Hemoglobin: 16.1 g/dL (ref 13.0–17.0)
MCH: 32.9 pg (ref 26.0–34.0)
MCHC: 36 g/dL (ref 30.0–36.0)
MCV: 91.4 fL (ref 80.0–100.0)
Platelets: 264 10*3/uL (ref 150–400)
RBC: 4.89 MIL/uL (ref 4.22–5.81)
RDW: 11.1 % — ABNORMAL LOW (ref 11.5–15.5)
WBC: 8.6 10*3/uL (ref 4.0–10.5)
nRBC: 0 % (ref 0.0–0.2)

## 2020-07-16 LAB — COMPREHENSIVE METABOLIC PANEL
ALT: 24 U/L (ref 0–44)
AST: 18 U/L (ref 15–41)
Albumin: 4.3 g/dL (ref 3.5–5.0)
Alkaline Phosphatase: 60 U/L (ref 38–126)
Anion gap: 8 (ref 5–15)
BUN: 14 mg/dL (ref 6–20)
CO2: 25 mmol/L (ref 22–32)
Calcium: 9.1 mg/dL (ref 8.9–10.3)
Chloride: 106 mmol/L (ref 98–111)
Creatinine, Ser: 1.07 mg/dL (ref 0.61–1.24)
GFR, Estimated: >60 mL/min (ref >60)
Glucose, Bld: 224 mg/dL — ABNORMAL HIGH (ref 70–99)
Potassium: 3.9 mmol/L (ref 3.5–5.1)
Sodium: 139 mmol/L (ref 135–145)
Total Bilirubin: 1.4 mg/dL — ABNORMAL HIGH (ref 0.3–1.2)
Total Protein: 7.4 g/dL (ref 6.5–8.1)

## 2020-07-16 LAB — LIPASE, BLOOD: Lipase: 24 U/L (ref 11–51)

## 2020-07-16 MED ORDER — HYDROCODONE-ACETAMINOPHEN 5-325 MG PO TABS: 1.0000 | ORAL_TABLET | Freq: Four times a day (QID) | ORAL | 0 refills | Status: DC | PRN

## 2020-07-16 MED ORDER — TAMSULOSIN HCL 0.4 MG PO CAPS: 0.4000 mg | ORAL_CAPSULE | Freq: Every day | ORAL | 0 refills | Status: DC

## 2020-07-16 MED ORDER — MORPHINE SULFATE (PF) 4 MG/ML IV SOLN
4.0000 mg | Freq: Once | INTRAVENOUS | Status: AC
  Administered 2020-07-16: 4 mg via INTRAVENOUS
  Filled 2020-07-16: qty 1

## 2020-07-16 MED ORDER — KETOROLAC TROMETHAMINE 30 MG/ML IJ SOLN
30.0000 mg | Freq: Once | INTRAMUSCULAR | Status: AC
  Administered 2020-07-16: 30 mg via INTRAVENOUS
  Filled 2020-07-16: qty 1

## 2020-07-16 MED ORDER — SODIUM CHLORIDE 0.9 % IV BOLUS
1000.0000 mL | Freq: Once | INTRAVENOUS | Status: AC
  Administered 2020-07-16: 1000 mL via INTRAVENOUS

## 2020-07-16 NOTE — ED Provider Notes (Signed)
[place] [facility] Provider Note   CSN: [id number] Arrival date & time: 07/16/20  1437     History Chief Complaint  Patient presents with  . Abdominal Pain    Gregory Bernard is a 50 y.o. male here with L flank pain, testicular pain. Patient has l flank pain radiate down to testicle for the last 4 days. Went to urgent care 3 days ago and was diagnosed with renal colic. He was discharged with toradol and initially felt better but pain came back today and was worse. States that pain is sharp, 10/10 and radiate to the left testicle.    The history is provided by the patient.       Past Medical History:  Diagnosis Date  . Diverticulitis     Patient Active Problem List   Diagnosis Date Noted  . 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 Pre-diabetes 02/02/2018  . Snoring 02/02/2018  . Tinea corporis 02/02/2018  . Elevated LDL cholesterol level 08/04/2017  . Rash of back 01/20/2017  . Family history of type II diabetes mellitus 01/20/2017  . Contact dermatitis 01/20/2017  . Pharyngitis 01/20/2017  . Healthcare maintenance 01/20/2017    Past Surgical History:  Procedure Laterality Date  . FRACTURE SURGERY    . VASECTOMY         Family History  Problem Relation Age of Onset  . Heart attack Mother   . Diabetes Mother   . Hiatal hernia Father   . Dementia Father   . Diabetes Brother     Social History   Tobacco Use  . Smoking status: Never Smoker  . Smokeless tobacco: Never Used  Vaping Use  . Vaping Use: Never used  Substance Use Topics  . Alcohol use: No  . Drug use: No    Home Medications Prior to Admission medications   Medication Sig Start Date End Date Taking? Authorizing Provider  ibuprofen (ADVIL,MOTRIN) 200 MG tablet Take 400 mg by mouth every 6 (six) hours as needed (pain).    [provider]  ketoconazole (NIZORAL) 2 % shampoo Use on scalp once weekly. 12/29/18   Danford, Valetta Fuller D, NP  ketorolac (TORADOL) 10 MG tablet Take 1  tablet (10 mg total) by mouth every 6 (six) hours as needed. 07/14/20   Hall-Potvin, [place], PA-C  ondansetron (ZOFRAN ODT) 4 MG disintegrating tablet Take 1 tablet (4 mg total) by mouth every 8 (eight) hours as needed for nausea or vomiting. 07/14/20   Hall-Potvin, [place], PA-C    Allergies    Patient has no known allergies.  Review of Systems   Review of Systems  Gastrointestinal: Positive for abdominal pain.  All other systems reviewed and are negative.   Physical Exam Updated Vital Signs BP 139/88 (BP Location: Right Arm)   Pulse 76   Temp 98.5 F (36.9 C) (Oral)   Resp 16   Ht 5\' 6"  (1.676 m)   Wt 81.6 kg   SpO2 98%   BMI 29.05 kg/m   Physical Exam Vitals and nursing note reviewed.  Constitutional:      Comments: Uncomfortable   HENT:     Head: Normocephalic.     Mouth/Throat:     Mouth: Mucous membranes are moist.  Eyes:     Extraocular Movements: Extraocular movements intact.     Pupils: Pupils are equal, round, and reactive to light.  Cardiovascular:     Rate and Rhythm: Normal rate and regular rhythm.     Heart sounds: Normal heart sounds.  Pulmonary:     Effort: Pulmonary effort is normal.     Breath sounds: Normal breath sounds.  Abdominal:     General: Abdomen is flat.     Palpations: Abdomen is soft.     Comments: Mild L CVAT   Genitourinary:    Comments: Testicle- nontender bilaterally, good cremasteric reflex  Skin:    General: Skin is warm.     Capillary Refill: Capillary refill takes less than 2 seconds.  Neurological:     General: No focal deficit present.     Mental Status: He is alert and oriented to person, place, and time.  Psychiatric:        Mood and Affect: Mood normal.        Behavior: Behavior normal.     ED Results / Procedures / Treatments   Labs (all labs ordered are listed, but only abnormal results are displayed) Labs Reviewed  COMPREHENSIVE METABOLIC PANEL - Abnormal; Notable for the following components:       Result Value   Glucose, Bld 224 (*)    Total Bilirubin 1.4 (*)    All other components within normal limits  CBC - Abnormal; Notable for the following components:   RDW 11.1 (*)    All other components within normal limits  URINALYSIS, ROUTINE W REFLEX MICROSCOPIC - Abnormal; Notable for the following components:   Glucose, UA >=500 (*)    Hgb urine dipstick SMALL (*)    All other components within normal limits  URINALYSIS, MICROSCOPIC (REFLEX) - Abnormal; Notable for the following components:   Bacteria, UA FEW (*)    All other components within normal limits  LIPASE, BLOOD    EKG None  Radiology US Renal  Result Date: 07/16/2020 CLINICAL DATA:  Left flank pain radiating to the left testicle for 4 days. Hematuria. EXAM: RENAL / URINARY TRACT ULTRASOUND COMPLETE COMPARISON:  None. FINDINGS: Right Kidney: Renal measurements: 11.6 x 5 x 6 cm = volume: 179 mL. Echogenicity within normal limits. No mass or hydronephrosis visualized. Left Kidney: Renal measurements: 12.6 x 7 x 6 cm = volume: 253 mL. Normal parenchymal echotexture. A small echogenic focus is demonstrated in the left renal pelvis with suggestion of mild hydronephrosis, possibly representing a left pelvic stone. Bladder: Appears normal for degree of bladder distention. Other: None. IMPRESSION: 1. Possible stone in the left renal pelvis with mild pyelocaliectasis. 2. Right kidney and bladder are unremarkable. Electronically Signed   By: Lucienne Capers M.D.   On: 07/16/2020 18:27   US SCROTUM W/DOPPLER  Result Date: 07/16/2020 CLINICAL DATA:  Left flank pain radiating to the left testicle for 4 days. No known injury. EXAM: SCROTAL ULTRASOUND DOPPLER ULTRASOUND OF THE TESTICLES TECHNIQUE: Complete ultrasound examination of the testicles, epididymis, and other scrotal structures was performed. Color and spectral Doppler ultrasound were also utilized to evaluate blood flow to the testicles. COMPARISON:  None. FINDINGS: Right testicle  Measurements: 4.6 x 2.5 x 3.3 cm. No mass or microlithiasis visualized. Left testicle Measurements: 4.6 x 2.1 x 2.9 cm. No mass or microlithiasis visualized. Right epididymis:  Normal in size and appearance. Left epididymis: Small epididymal cyst or spermatocele measuring 8 mm maximal diameter. Hydrocele:  Minimal bilateral hydroceles. Varicocele:  Minimal left varicocele. Pulsed Doppler interrogation of both testes demonstrates normal low resistance arterial and venous waveforms bilaterally. IMPRESSION: 1. Normal ultrasound appearance of the testicles. No evidence of testicular mass, torsion, or inflammation. 2. Minimal bilateral hydroceles. Minimal left varicocele. Electronically Signed   By: Gwyndolyn Saxon  Andria Meuse M.D.   On: 07/16/2020 18:24    Procedures Procedures (including critical care time)  Medications Ordered in ED Medications  ketorolac (TORADOL) 30 MG/ML injection 30 mg (30 mg Intravenous Given 07/16/20 2002)  sodium chloride 0.9 % bolus 1,000 mL (1,000 mLs Intravenous New Bag/Given 07/16/20 1957)  morphine 4 MG/ML injection 4 mg (4 mg Intravenous Given 07/16/20 1959)    ED Course  I have reviewed the triage vital signs and the nursing notes.  Pertinent labs & imaging results that were available during my care of the patient were reviewed by me and considered in my medical decision making (see chart for details).    MDM Rules/Calculators/A&P                         Gregory Bernard is a 50 y.o. male here with L flank pain, L testicular pain. Likely renal colic, less likely testicular torsion. Will get renal US, testicular US, cbc, cmp, UA.    8:34 PM UA showed no torsion, there is possible stone in the L renal pelvis. Clinically, likely has renal colic. Pain improved with pain meds. Will dc home with vicodin, flomax, urology follow up.   Final Clinical Impression(s) / ED Diagnoses Final diagnoses:  Left testicular pain    Rx / DC Orders ED Discharge Orders    None       Charlynne Pander, MD 07/16/20 2034

## 2020-07-16 NOTE — ED Triage Notes (Signed)
Pt c/o left sided abdominal pain that radiates into left testicle. Pain also radiates into back.

## 2020-07-16 NOTE — ED Notes (Signed)
Pt. C/o L side flank pain and reports he has been diagnosed with a kidney stone and still trying to pass the stone.  Pt. Has been given meds via IV and will remain with fluid infusing.  Pt. Tolerated IV pain meds well.

## 2020-07-16 NOTE — Discharge Instructions (Signed)
You likely have a kidney stone   Take motrin for pain   Take norco for severe pain   Take flomax to help you urinate   See Dr. Berneice Heinrich for follow up   Return to ER if you have worse flank pain, trouble urinating, fever, vomiting

## 2021-08-30 LAB — BASIC METABOLIC PANEL
CO2: 23 — AB (ref 13–22)
Chloride: 102 (ref 99–108)
Glucose: 201
Potassium: 4.4 mEq/L (ref 3.5–5.1)
Sodium: 138 (ref 137–147)

## 2021-08-30 LAB — HEPATIC FUNCTION PANEL
ALT: 35 U/L (ref 10–40)
AST: 17 (ref 14–40)
Alkaline Phosphatase: 80 (ref 25–125)
Bilirubin, Total: 1.3

## 2021-08-30 LAB — COMPREHENSIVE METABOLIC PANEL
Albumin: 4.7 (ref 3.5–5.0)
Calcium: 9.7 (ref 8.7–10.7)
EGFR (Non-African Amer.): 92
Globulin: 2.8
PSA, Total: 0.78

## 2021-08-30 LAB — LIPID PANEL
Cholesterol: 233 — AB (ref 0–200)
HDL: 33 — AB (ref 35–70)
LDL Cholesterol: 166
Triglycerides: 187 — AB (ref 40–160)

## 2021-08-30 LAB — CBC AND DIFFERENTIAL
HCT: 51 (ref 41–53)
Hemoglobin: 17.5 (ref 13.5–17.5)

## 2021-09-27 ENCOUNTER — Ambulatory Visit: Payer: 59 | Admitting: Nurse Practitioner

## 2021-09-27 ENCOUNTER — Other Ambulatory Visit: Payer: Self-pay

## 2021-09-27 ENCOUNTER — Encounter: Payer: Self-pay | Admitting: Nurse Practitioner

## 2021-09-27 ENCOUNTER — Other Ambulatory Visit: Payer: Self-pay | Admitting: Nurse Practitioner

## 2021-09-27 VITALS — BP 114/63 | HR 77 | Temp 97.9°F | Ht 66.93 in | Wt 176.2 lb

## 2021-09-27 DIAGNOSIS — R7301 Impaired fasting glucose: Secondary | ICD-10-CM | POA: Diagnosis not present

## 2021-09-27 DIAGNOSIS — E1165 Type 2 diabetes mellitus with hyperglycemia: Secondary | ICD-10-CM | POA: Diagnosis not present

## 2021-09-27 DIAGNOSIS — Z6827 Body mass index (BMI) 27.0-27.9, adult: Secondary | ICD-10-CM | POA: Diagnosis not present

## 2021-09-27 DIAGNOSIS — Z7689 Persons encountering health services in other specified circumstances: Secondary | ICD-10-CM | POA: Diagnosis not present

## 2021-09-27 LAB — POCT GLYCOSYLATED HEMOGLOBIN (HGB A1C): Hemoglobin A1C: 9.2 % — AB (ref 4.0–5.6)

## 2021-09-27 MED ORDER — XIGDUO XR 2.5-1000 MG PO TB24: 1.0000 | ORAL_TABLET | Freq: Every day | ORAL | 1 refills | Status: DC

## 2021-09-27 MED ORDER — SYNJARDY 5-500 MG PO TABS: 1.0000 | ORAL_TABLET | Freq: Two times a day (BID) | ORAL | 2 refills | Status: DC

## 2021-09-27 NOTE — Telephone Encounter (Signed)
Changed to Synjardy 5/'500mg'$  twice daily and sent new prescripriptin to his pharmacy

## 2021-09-27 NOTE — Progress Notes (Signed)
Patient started on synjardy 5/'500mg'$  tablets twice daily at time of visit

## 2021-09-27 NOTE — Progress Notes (Signed)
? ?New Patient Office Visit ? ?Subjective:  ?Patient ID: Gregory Bernard, male    DOB: [birth year]  Age: 51 y.o. MRN: [id number] ? ?CC:  ?Chief Complaint  ?Patient presents with  ? New Patient (Initial Visit)  ? ? ?HPI ?Gregory Bernard presents to establish new primary care provider. He is a Airline pilot for the [employer] He had assessment about 6 weeks ago. This showed that his blood sugar and cholesterol levels were elevated.  States that over the past five years, blood sugars had been running around 100. This time, his blood sugar was slightly over 200. Cholesterol was generally, mildly elevated. He cannot remember if he was fasting.  ?Has been watching his diet. Does not exercise routinely, but states that he is very active. Has lost weight this year. He has not had check of HgbA1c as of yet.  ? ?Past Medical History:  ?Diagnosis Date  ? Diverticulitis   ? ? ?Past Surgical History:  ?Procedure Laterality Date  ? FRACTURE SURGERY    ? VASECTOMY    ? ? ?Family History  ?Problem Relation Age of Onset  ? Heart attack Mother   ? Diabetes Mother   ? Hiatal hernia Father   ? Dementia Father   ? Diabetes Brother   ? ? ?Social History  ? ?Socioeconomic History  ? Marital status: Married  ?  Spouse name: Not on file  ? Number of children: 3  ? Years of education: Not on file  ? Highest education level: Not on file  ?Occupational History  ? Occupation: IT trainer  ?Tobacco Use  ? Smoking status: Never  ? Smokeless tobacco: Never  ?Vaping Use  ? Vaping Use: Never used  ?Substance and Sexual Activity  ? Alcohol use: No  ? Drug use: Not Currently  ? Sexual activity: Yes  ?  Partners: Female  ?  Birth control/protection: Other-see comments  ?  Comment: Vasectomy  ?Other Topics Concern  ? Not on file  ?Social History Narrative  ? Not on file  ? ?Social Determinants of Health  ? ?Financial Resource Strain: Not on file  ?Food Insecurity: Not on file  ?Transportation Needs: Not on file  ?Physical Activity: Not on file   ?Stress: Not on file  ?Social Connections: Not on file  ?Intimate Partner Violence: Not on file  ? ? ?ROS ?Review of Systems  ?Constitutional:  Positive for fatigue. Negative for activity change, chills and fever.  ?HENT:  Negative for congestion, postnasal drip, rhinorrhea, sinus pressure, sinus pain, sneezing and sore throat.   ?Eyes: Negative.   ?Respiratory:  Negative for cough, shortness of breath and wheezing.   ?Cardiovascular:  Negative for chest pain and palpitations.  ?Gastrointestinal:  Negative for constipation, diarrhea, nausea and vomiting.  ?Endocrine: Negative for cold intolerance, heat intolerance, polydipsia and polyuria.  ?     Elevated blood sugar on recent labs done through his employer.   ?Genitourinary:  Negative for dysuria, frequency and urgency.  ?Musculoskeletal:  Negative for back pain and myalgias.  ?Skin:  Negative for rash.  ?Allergic/Immunologic: Negative for environmental allergies.  ?Neurological:  Negative for dizziness, weakness and headaches.  ?Psychiatric/Behavioral:  The patient is nervous/anxious.   ? ?Objective:  ? ?Today's Vitals  ? 09/27/21 1455  ?BP: 114/63  ?Pulse: 77  ?Temp: 97.9 ?F (36.6 ?C)  ?SpO2: 97%  ?Weight: 176 lb 3.2 oz (79.9 kg)  ?Height: 5' 6.93" (1.7 m)  ? ?Body mass index is 27.66 kg/m?.  ? ?Physical Exam ?Vitals  and nursing note reviewed.  ?Constitutional:   ?   Appearance: Normal appearance. He is well-developed.  ?HENT:  ?   Head: Normocephalic and atraumatic.  ?   Nose: Nose normal.  ?   Mouth/Throat:  ?   Mouth: Mucous membranes are moist.  ?   Pharynx: Oropharynx is clear.  ?Eyes:  ?   Extraocular Movements: Extraocular movements intact.  ?   Conjunctiva/sclera: Conjunctivae normal.  ?   Pupils: Pupils are equal, round, and reactive to light.  ?Cardiovascular:  ?   Rate and Rhythm: Normal rate and regular rhythm.  ?   Pulses: Normal pulses.  ?   Heart sounds: Normal heart sounds.  ?Pulmonary:  ?   Effort: Pulmonary effort is normal.  ?   Breath sounds:  Normal breath sounds.  ?Abdominal:  ?   Palpations: Abdomen is soft.  ?Musculoskeletal:     ?   General: Normal range of motion.  ?   Cervical back: Normal range of motion and neck supple.  ?Lymphadenopathy:  ?   Cervical: No cervical adenopathy.  ?Skin: ?   General: Skin is warm and dry.  ?   Capillary Refill: Capillary refill takes less than 2 seconds.  ?Neurological:  ?   General: No focal deficit present.  ?   Mental Status: He is alert and oriented to person, place, and time.  ?Psychiatric:     ?   Mood and Affect: Mood normal.     ?   Behavior: Behavior normal.     ?   Thought Content: Thought content normal.     ?   Judgment: Judgment normal.  ? ? ?Assessment & Plan:  ?1. Impaired fasting glucose ?HgbA1c 9.2 today. Will start combination Jardiance and Metformin. Both should be taken daily. Advised he check blood sugars daily. This should be done fasting. Goal is to have blood sugars between 70 and 100.  Advised he keep blood sugar log and bring with him to next visit to review.  ?- POCT glycosylated hemoglobin (Hb A1C) ? ?2. Type 2 diabetes mellitus with hyperglycemia, without long-term current use of insulin ([place]) ?HgbA1c 9.2 today. Will start combination Jardiance and Metformin. Both should be taken daily. Advised he check blood sugars daily. This should be done fasting. Goal is to have blood sugars between 70 and 100.  ? ?3. Body mass index 27.0-27.9, adult ?Encourage patient to limit calorie intake to 2000 cal/day or less.  He should consume a low cholesterol, low-fat diet.    ? ?4. Encounter to establish care ?Appointment today to establish new primary care provider   ? ?  ?Problem List Items Addressed This Visit   ? ?  ? Endocrine  ? Impaired fasting glucose - Primary  ? Relevant Orders  ? POCT glycosylated hemoglobin (Hb A1C) (Completed)  ? Type 2 diabetes mellitus with hyperglycemia, without long-term current use of insulin ([place])  ?  ? Other  ? Body mass index 27.0-27.9, adult  ? ?Other Visit  Diagnoses   ? ? Encounter to establish care      ? ?  ? ? ?Outpatient Encounter Medications as of 09/27/2021  ?Medication Sig  ? chlorpheniramine (CHLOR-TRIMETON) 4 MG tablet 1 tablet as needed  ? [DISCONTINUED] Dapagliflozin-metFORMIN HCl ER (XIGDUO XR) 2.11-998 MG TB24 Take 1 tablet by mouth daily.  ? [DISCONTINUED] Empagliflozin-metFORMIN HCl (SYNJARDY) 5-500 MG TABS Take 1 tablet by mouth 2 (two) times daily.  ? [DISCONTINUED] HYDROcodone-acetaminophen (NORCO/VICODIN) 5-325 MG tablet Take 1 tablet  by mouth every 6 (six) hours as needed.  ? [DISCONTINUED] ibuprofen (ADVIL,MOTRIN) 200 MG tablet Take 400 mg by mouth every 6 (six) hours as needed (pain).  ? [DISCONTINUED] ketoconazole (NIZORAL) 2 % shampoo Use on scalp once weekly.  ? [DISCONTINUED] ketorolac (TORADOL) 10 MG tablet Take 1 tablet (10 mg total) by mouth every 6 (six) hours as needed.  ? [DISCONTINUED] ondansetron (ZOFRAN ODT) 4 MG disintegrating tablet Take 1 tablet (4 mg total) by mouth every 8 (eight) hours as needed for nausea or vomiting.  ? [DISCONTINUED] tamsulosin (FLOMAX) 0.4 MG CAPS capsule Take 1 capsule (0.4 mg total) by mouth daily.  ? ?No facility-administered encounter medications on file as of 09/27/2021.  ? ? ?Follow-up: Return in about 4 weeks (around 10/25/2021) for new diagnosis diabetes.  ? ?Ronnell Freshwater, NP ? ?

## 2021-09-28 ENCOUNTER — Other Ambulatory Visit: Payer: Self-pay | Admitting: Nurse Practitioner

## 2021-09-28 DIAGNOSIS — E1165 Type 2 diabetes mellitus with hyperglycemia: Secondary | ICD-10-CM

## 2021-09-28 MED ORDER — EMPAGLIFLOZIN 10 MG PO TABS: 10.0000 mg | ORAL_TABLET | Freq: Every day | ORAL | 1 refills | Status: DC

## 2021-09-28 MED ORDER — METFORMIN HCL 500 MG PO TABS: 500.0000 mg | ORAL_TABLET | Freq: Two times a day (BID) | ORAL | 2 refills | Status: DC

## 2021-09-28 NOTE — Telephone Encounter (Signed)
Changed Synjardy to Metformin '500mg'$  twice daily and jardiance '10mg'$  daily. Both sent to [facility].

## 2021-09-28 NOTE — Progress Notes (Signed)
Changed Synjardy to Metformin '500mg'$  twice daily and jardiance '10mg'$  daily. Both sent to [facility].  ?

## 2021-10-07 DIAGNOSIS — Z6827 Body mass index (BMI) 27.0-27.9, adult: Secondary | ICD-10-CM | POA: Insufficient documentation

## 2021-10-07 DIAGNOSIS — R7301 Impaired fasting glucose: Secondary | ICD-10-CM | POA: Insufficient documentation

## 2021-10-07 DIAGNOSIS — E1165 Type 2 diabetes mellitus with hyperglycemia: Secondary | ICD-10-CM | POA: Insufficient documentation

## 2021-10-07 NOTE — Patient Instructions (Signed)
Fat and Cholesterol Restricted Eating Plan Getting too much fat and cholesterol in your diet may cause health problems. Choosing the right foods helps keep your fat and cholesterol at normal levels. This can keep you from getting certain diseases. Your doctor may recommend an eating plan that includes: Total fat: ______% or less of total calories a day. This is ______g of fat a day. Saturated fat: ______% or less of total calories a day. This is ______g of saturated fat a day. Cholesterol: less than _________mg a day. Fiber: ______g a day. What are tips for following this plan? General tips Work with your doctor to lose weight if you need to. Avoid: Foods with added sugar. Fried foods. Foods with trans fat or partially hydrogenated oils. This includes some margarines and baked goods. If you drink alcohol: Limit how much you have to: 0-1 drink a day for women who are not pregnant. 0-2 drinks a day for men. Know how much alcohol is in a drink. In the [place] one drink equals one 12 oz bottle of beer (355 mL), one 5 oz glass of wine (148 mL), or one 1 oz glass of hard liquor (44 mL). Reading food labels Check food labels for: Trans fats. Partially hydrogenated oils. Saturated fat (g) in each serving. Cholesterol (mg) in each serving. Fiber (g) in each serving. Choose foods with healthy fats, such as: Monounsaturated fats and polyunsaturated fats. These include olive and canola oil, flaxseeds, walnuts, almonds, and seeds. Omega-3 fats. These are found in certain fish, flaxseed oil, and ground flaxseeds. Choose grain products that have whole grains. Look for the word "whole" as the first word in the ingredient list. Cooking Cook foods using low-fat methods. These include baking, boiling, grilling, and broiling. Eat more home-cooked foods. Eat at restaurants and buffets less often. Eat less fast food. Avoid cooking using saturated fats, such as butter, cream, palm oil, palm kernel oil, and  coconut oil. Meal planning  At meals, divide your plate into four equal parts: Fill one-half of your plate with vegetables, green salads, and fruit. Fill one-fourth of your plate with whole grains. Fill one-fourth of your plate with low-fat (lean) protein foods. Eat fish that is high in omega-3 fats at least two times a week. This includes mackerel, tuna, sardines, and salmon. Eat foods that are high in fiber, such as whole grains, beans, apples, pears, berries, broccoli, carrots, peas, and barley. What foods should I eat? Fruits All fresh, canned (in natural juice), or frozen fruits. Vegetables Fresh or frozen vegetables (raw, steamed, roasted, or grilled). Green salads. Grains Whole grains, such as whole wheat or whole grain breads, crackers, cereals, and pasta. Unsweetened oatmeal, bulgur, barley, quinoa, or brown rice. Corn or whole wheat flour tortillas. Meats and other protein foods Ground beef (85% or leaner), grass-fed beef, or beef trimmed of fat. Skinless chicken or turkey. Ground chicken or turkey. Pork trimmed of fat. All fish and seafood. Egg whites. Dried beans, peas, or lentils. Unsalted nuts or seeds. Unsalted canned beans. Nut butters without added sugar or oil. Dairy Low-fat or nonfat dairy products, such as skim or 1% milk, 2% or reduced-fat cheeses, low-fat and fat-free ricotta or cottage cheese, or plain low-fat and nonfat yogurt. Fats and oils Tub margarine without trans fats. Light or reduced-fat mayonnaise and salad dressings. Avocado. Olive, canola, sesame, or safflower oils. The items listed above may not be a complete list of foods and beverages you can eat. Contact a dietitian for more information. What foods   should I avoid? Fruits Canned fruit in heavy syrup. Fruit in cream or butter sauce. Fried fruit. Vegetables Vegetables cooked in cheese, cream, or butter sauce. Fried vegetables. Grains Delahoz bread. [place] pasta. Rodocker rice. Cornbread. Bagels, pastries,  and croissants. Crackers and snack foods that contain trans fat and hydrogenated oils. Meats and other protein foods Fatty cuts of meat. Ribs, chicken wings, bacon, sausage, bologna, salami, chitterlings, fatback, hot dogs, bratwurst, and packaged lunch meats. Liver and organ meats. Whole eggs and egg yolks. Chicken and turkey with skin. Fried meat. Dairy Whole or 2% milk, cream, half-and-half, and cream cheese. Whole milk cheeses. Whole-fat or sweetened yogurt. Full-fat cheeses. Nondairy creamers and whipped toppings. Processed cheese, cheese spreads, and cheese curds. Fats and oils Butter, stick margarine, lard, shortening, ghee, or bacon fat. Coconut, palm kernel, and palm oils. Beverages Alcohol. Sugar-sweetened drinks such as sodas, lemonade, and fruit drinks. Sweets and desserts Corn syrup, sugars, honey, and molasses. Candy. Jam and jelly. Syrup. Sweetened cereals. Cookies, pies, cakes, donuts, muffins, and ice cream. The items listed above may not be a complete list of foods and beverages you should avoid. Contact a dietitian for more information. Summary Choosing the right foods helps keep your fat and cholesterol at normal levels. This can keep you from getting certain diseases. At meals, fill one-half of your plate with vegetables, green salads, and fruits. Eat high fiber foods, like whole grains, beans, apples, pears, berries, carrots, peas, and barley. Limit added sugar, saturated fats, alcohol, and fried foods. This information is not intended to replace advice given to you by your health care provider. Make sure you discuss any questions you have with your health care provider. Document Revised: 11/10/2020 Document Reviewed: 11/10/2020 Elsevier Patient Education  2022 Elsevier Inc.  

## 2021-10-24 ENCOUNTER — Ambulatory Visit (INDEPENDENT_AMBULATORY_CARE_PROVIDER_SITE_OTHER): Payer: 59 | Admitting: Nurse Practitioner

## 2021-10-24 ENCOUNTER — Encounter: Payer: Self-pay | Admitting: Nurse Practitioner

## 2021-10-24 VITALS — BP 113/63 | HR 77 | Temp 98.1°F | Ht 66.93 in | Wt 171.0 lb

## 2021-10-24 DIAGNOSIS — Z6826 Body mass index (BMI) 26.0-26.9, adult: Secondary | ICD-10-CM | POA: Diagnosis not present

## 2021-10-24 DIAGNOSIS — E1165 Type 2 diabetes mellitus with hyperglycemia: Secondary | ICD-10-CM

## 2021-10-24 LAB — POCT GLYCOSYLATED HEMOGLOBIN (HGB A1C): Hemoglobin A1C: 7.5 % — AB (ref 4.0–5.6)

## 2021-10-24 LAB — POCT UA - MICROALBUMIN
Creatinine, POC: 200 mg/dL
Microalbumin Ur, POC: 10 mg/L

## 2021-10-24 NOTE — Progress Notes (Signed)
Established patient visit ? ? ?Patient: Gregory Bernard   DOB: April 29, 1971   51 y.o. Male  MRN: [id number] ?Visit Date: 10/24/2021 ? ? ?Chief Complaint  ?Patient presents with  ? Follow-up  ? ?Subjective  ?  ?HPI  ?Follow up new diagnosis of Type 2 diabetes. HgbA1c 9.2.  ?-started on Synjardy 5/'500mg'$  tablets twice daily ?-should be monitoring blood sugars at least once per day. States that blood sugars have not been over 150 since he first found out that his blood sugars were elevated.  ?-weight loss of 5 pounds since most recent visit.  ?-he may need recheck of HgbA1c to go back to work if blood sugars are doing well.  ? ?Medications: ?Outpatient Medications Prior to Visit  ?Medication Sig  ? SYNJARDY 5-500 MG TABS Take 1 tablet by mouth 2 (two) times daily.  ? [DISCONTINUED] chlorpheniramine (CHLOR-TRIMETON) 4 MG tablet 1 tablet as needed  ? [DISCONTINUED] empagliflozin (JARDIANCE) 10 MG TABS tablet Take 1 tablet (10 mg total) by mouth daily before breakfast.  ? [DISCONTINUED] metFORMIN (GLUCOPHAGE) 500 MG tablet Take 1 tablet (500 mg total) by mouth 2 (two) times daily with a meal.  ? ?No facility-administered medications prior to visit.  ? ? ?Review of Systems  ?Constitutional:  Negative for activity change, chills, fatigue and fever.  ?     Weight loss five pounds since he was recenlty   ?HENT:  Negative for congestion, postnasal drip, rhinorrhea, sinus pressure, sinus pain, sneezing and sore throat.   ?Eyes: Negative.   ?Respiratory:  Negative for cough, shortness of breath and wheezing.   ?Cardiovascular:  Negative for chest pain and palpitations.  ?     Blood pressure well managed   ?Gastrointestinal:  Negative for constipation, diarrhea, nausea and vomiting.  ?Endocrine: Negative for cold intolerance, heat intolerance, polydipsia and polyuria.  ?     Patient taking Synjardy once daily. Has not been taking it twice daily. States that blood sugars have been mostly under 140.   ?Genitourinary:  Negative for  dysuria, frequency and urgency.  ?Musculoskeletal:  Negative for back pain and myalgias.  ?Skin:  Negative for rash.  ?Allergic/Immunologic: Negative for environmental allergies.  ?Neurological:  Negative for dizziness, weakness and headaches.  ?Psychiatric/Behavioral:  The patient is not nervous/anxious.   ? ? ? ? Objective  ?  ? ?Today's Vitals  ? 10/24/21 0818  ?BP: 113/63  ?Pulse: 77  ?Temp: 98.1 ?F (36.7 ?C)  ?SpO2: 100%  ?Weight: 171 lb (77.6 kg)  ?Height: 5' 6.93" (1.7 m)  ? ?Body mass index is 26.84 kg/m?.  ? ?BP Readings from Last 3 Encounters:  ?10/24/21 113/63  ?09/27/21 114/63  ?07/16/20 131/83  ?  ?Wt Readings from Last 3 Encounters:  ?10/24/21 171 lb (77.6 kg)  ?09/27/21 176 lb 3.2 oz (79.9 kg)  ?07/16/20 180 lb (81.6 kg)  ?  ?Physical Exam ?Vitals and nursing note reviewed.  ?Constitutional:   ?   Appearance: Normal appearance. He is well-developed.  ?HENT:  ?   Head: Normocephalic and atraumatic.  ?Eyes:  ?   Pupils: Pupils are equal, round, and reactive to light.  ?Cardiovascular:  ?   Rate and Rhythm: Normal rate and regular rhythm.  ?   Pulses: Normal pulses.  ?   Heart sounds: Normal heart sounds.  ?Pulmonary:  ?   Effort: Pulmonary effort is normal.  ?   Breath sounds: Normal breath sounds.  ?Abdominal:  ?   Palpations: Abdomen is soft.  ?Musculoskeletal:     ?  General: Normal range of motion.  ?   Cervical back: Normal range of motion and neck supple.  ?Lymphadenopathy:  ?   Cervical: No cervical adenopathy.  ?Skin: ?   General: Skin is warm and dry.  ?   Capillary Refill: Capillary refill takes less than 2 seconds.  ?Neurological:  ?   General: No focal deficit present.  ?   Mental Status: He is alert and oriented to person, place, and time.  ?Psychiatric:     ?   Mood and Affect: Mood normal.     ?   Behavior: Behavior normal.     ?   Thought Content: Thought content normal.     ?   Judgment: Judgment normal.  ?  ? ?Results for orders placed or performed in visit on 10/24/21  ?POCT Urine  Albumin-Creatinine with uACR  ?Result Value Ref Range  ? Microalbumin Ur, POC 10 mg/L  ? Creatinine, POC 200 mg/dL  ? Albumin/Creatinine Ratio, Urine, POC '30mg'$    ?POCT glycosylated hemoglobin (Hb A1C)  ?Result Value Ref Range  ? Hemoglobin A1C 7.5 (A) 4.0 - 5.6 %  ? HbA1c POC (<> result, manual entry)    ? HbA1c, POC (prediabetic range)    ? HbA1c, POC (controlled diabetic range)    ? ? Assessment & Plan  ?  ?1. Type 2 diabetes mellitus with hyperglycemia, without long-term current use of insulin ([place]) ?HgbA1c 7.5 today. Much improved from initial check on 09/28/2021 which was 9.2. will continue Synjardy 5/'500mg'$  twice daily. Monitor fasting blood sugars daily. Goal is to keep fasting sugars under 130. Recheck HgbA1c in 3 months. Urine microalbumin is normal today.  ?- POCT Urine Albumin-Creatinine with uACR ?- POCT glycosylated hemoglobin (Hb A1C) ? ?2. Body mass index 26.0-26.9, adult ?Encourage patient to limit calorie intake to 2000 cal/day or less.  He should consume a low cholesterol, low-fat diet.  Patient plans to start incorporating regular exercise into his daily routine. He has already lost 5 pounds.  ?  ? ?Return in about 3 months (around 01/23/2022) for diabetes with HgbA1c check.  ?   ? ? ? ? ?Ronnell Freshwater, NP  ?[place] [facility] ?[phone] (phone) [phone] (fax) ? ?[facility]  ?

## 2022-01-21 NOTE — Progress Notes (Unsigned)
Established patient visit   Patient: Gregory Bernard   DOB: Sep 30, 1970   51 y.o. Male  MRN: [id number] Visit Date: 01/22/2022   No chief complaint on file.  Subjective    HPI  Follow up for type 2 diabetes  -due to have check of HgbA1c today. Most recent check done 10/2021 and was 7.5.    Medications: Outpatient Medications Prior to Visit  Medication Sig   SYNJARDY 5-500 MG TABS Take 1 tablet by mouth 2 (two) times daily.   No facility-administered medications prior to visit.    Review of Systems  {Labs (Optional):23779}   Objective    There were no vitals taken for this visit. BP Readings from Last 3 Encounters:  10/24/21 113/63  09/27/21 114/63  07/16/20 131/83    Wt Readings from Last 3 Encounters:  10/24/21 171 lb (77.6 kg)  09/27/21 176 lb 3.2 oz (79.9 kg)  07/16/20 180 lb (81.6 kg)    Physical Exam  ***  No results found for any visits on 01/22/22.  Assessment & Plan     Problem List Items Addressed This Visit   None    No follow-ups on file.         Ronnell Freshwater, NP  [facility] at [facility] [phone] (phone) [phone] (fax)  [place]

## 2022-01-22 ENCOUNTER — Encounter: Payer: Self-pay | Admitting: Nurse Practitioner

## 2022-01-22 ENCOUNTER — Ambulatory Visit: Payer: 59 | Admitting: Nurse Practitioner

## 2022-01-22 VITALS — BP 96/57 | HR 72 | Ht 66.93 in | Wt 171.1 lb

## 2022-01-22 DIAGNOSIS — Z6826 Body mass index (BMI) 26.0-26.9, adult: Secondary | ICD-10-CM | POA: Diagnosis not present

## 2022-01-22 DIAGNOSIS — E1165 Type 2 diabetes mellitus with hyperglycemia: Secondary | ICD-10-CM | POA: Diagnosis not present

## 2022-01-22 MED ORDER — METFORMIN HCL ER 500 MG PO TB24: 500.0000 mg | ORAL_TABLET | Freq: Every day | ORAL | 1 refills | Status: DC

## 2022-01-23 LAB — HEMOGLOBIN A1C
Est. average glucose Bld gHb Est-mCnc: 174 mg/dL
Hgb A1c MFr Bld: 7.7 % — ABNORMAL HIGH (ref 4.8–5.6)

## 2022-01-27 ENCOUNTER — Encounter: Payer: Self-pay | Admitting: Nurse Practitioner

## 2022-04-24 ENCOUNTER — Ambulatory Visit: Payer: 59 | Admitting: Nurse Practitioner

## 2022-06-22 IMAGING — US US SCROTUM W/ DOPPLER COMPLETE
1 series · 14 of 25 positions shown · non-contrast
Comparison: None.

CLINICAL DATA: Left flank pain radiating to the left testicle for 4
days. No known injury.

EXAM:
SCROTAL ULTRASOUND
DOPPLER ULTRASOUND OF THE TESTICLES
TECHNIQUE: Complete ultrasound examination of the testicles, epididymis, and
other scrotal structures was performed. Color and spectral Doppler
ultrasound were also utilized to evaluate blood flow to the
testicles.

[Series 1: us scrotum w/ doppler complete · 82 acquisitions, 14 frames shown]
[im 1/82]
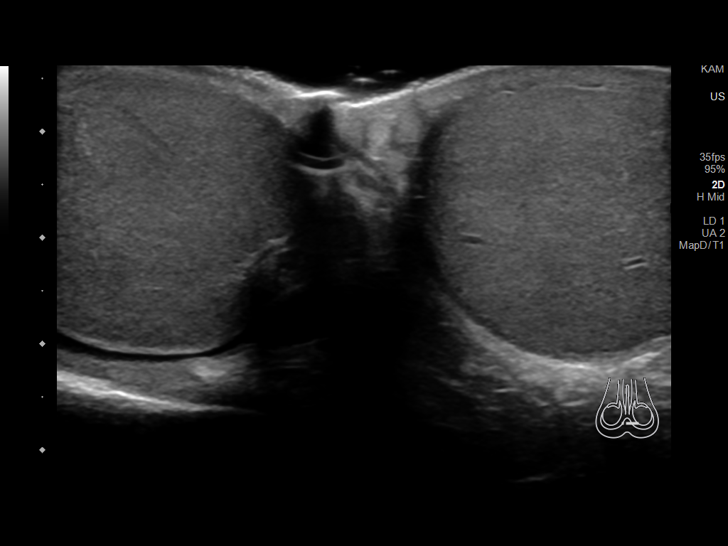
[im 7/82]
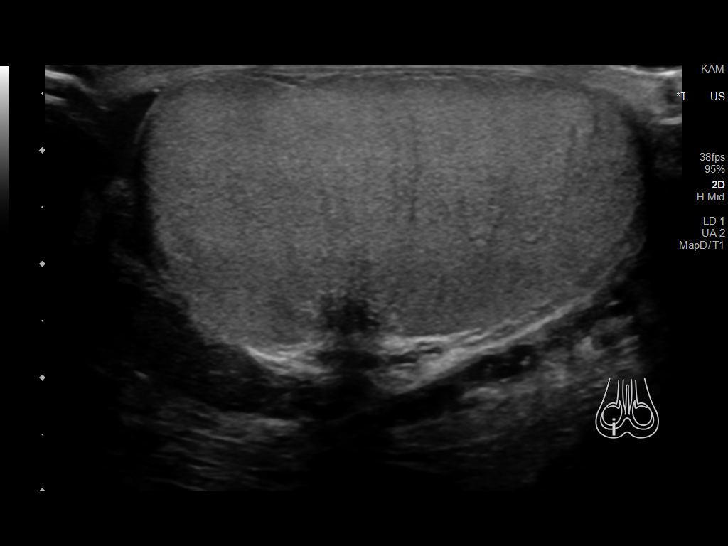
[im 14/82]
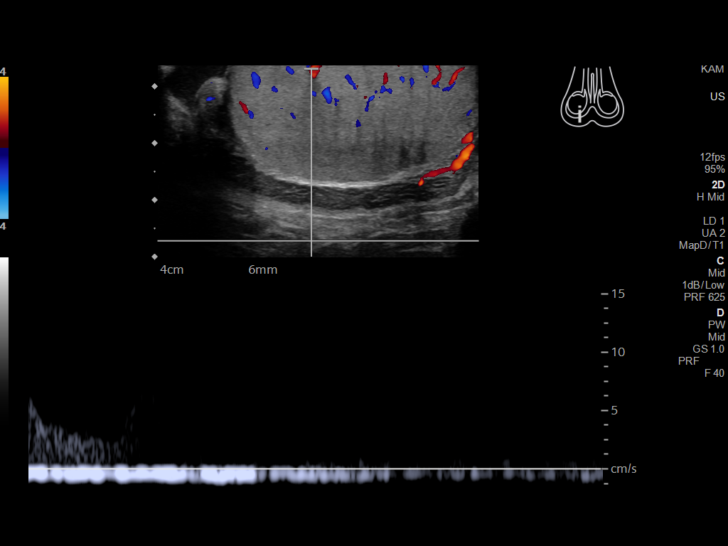
[im 21/82]
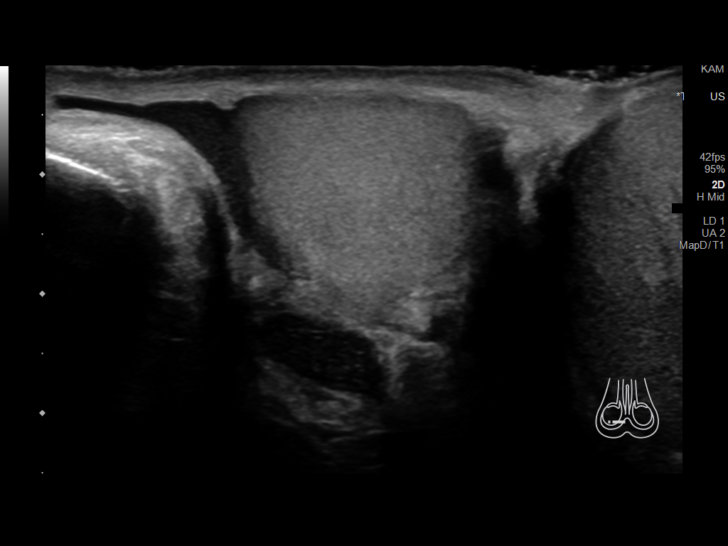
[im 28/82]
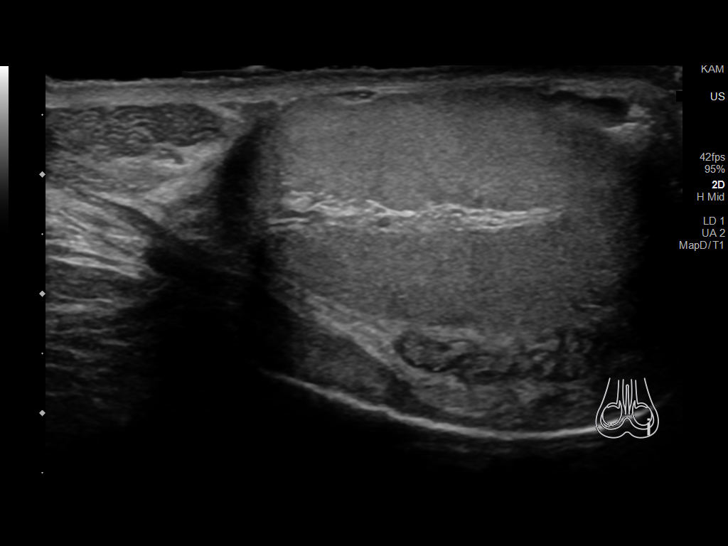
[im 31/82]
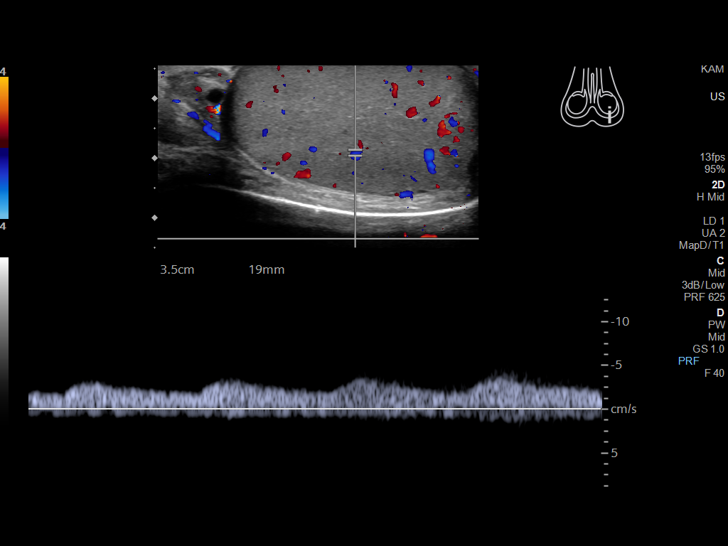
[im 38/82]
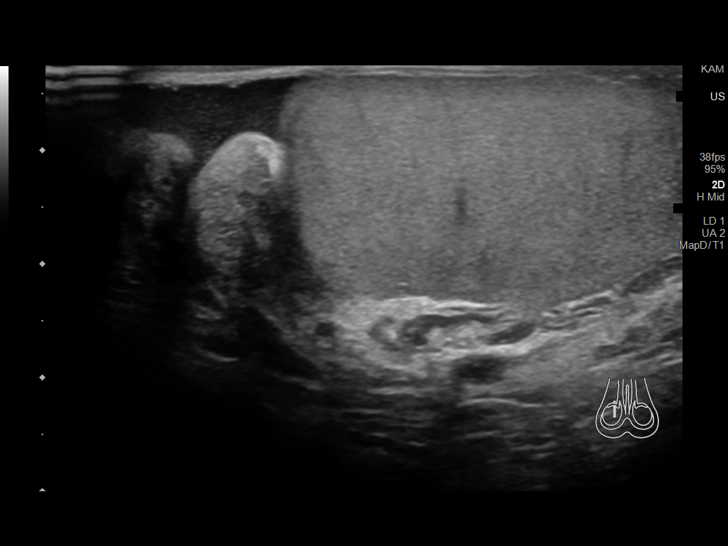
[im 44/82]
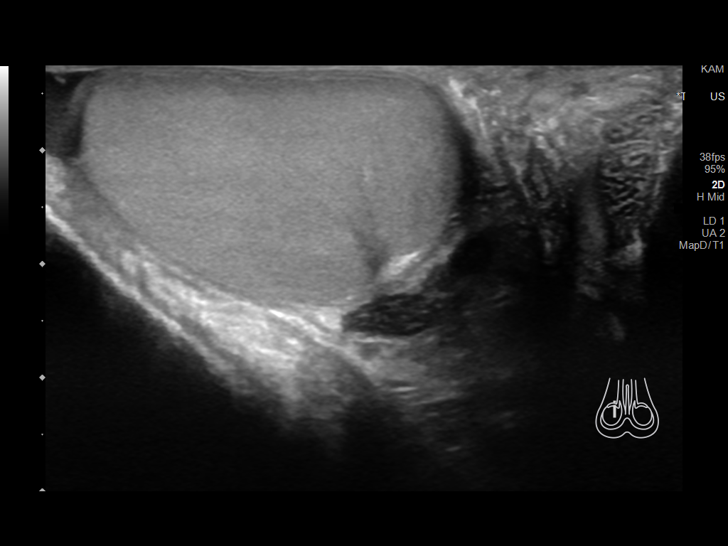
[im 51/82]
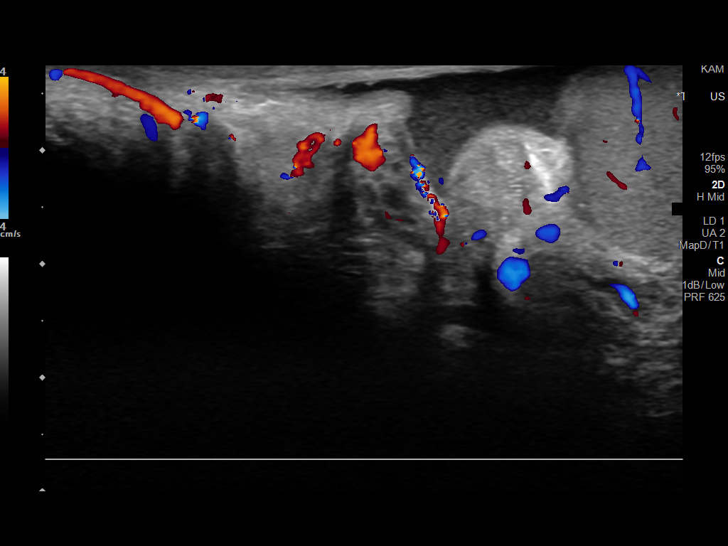
[im 55/82]
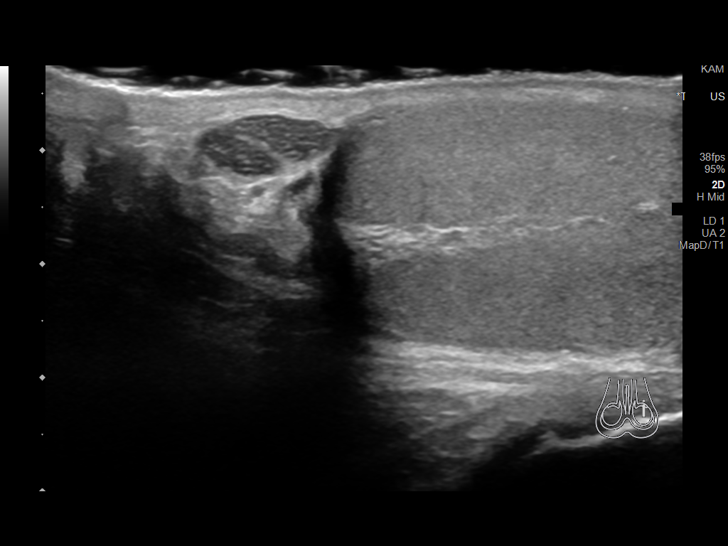
[im 61/82]
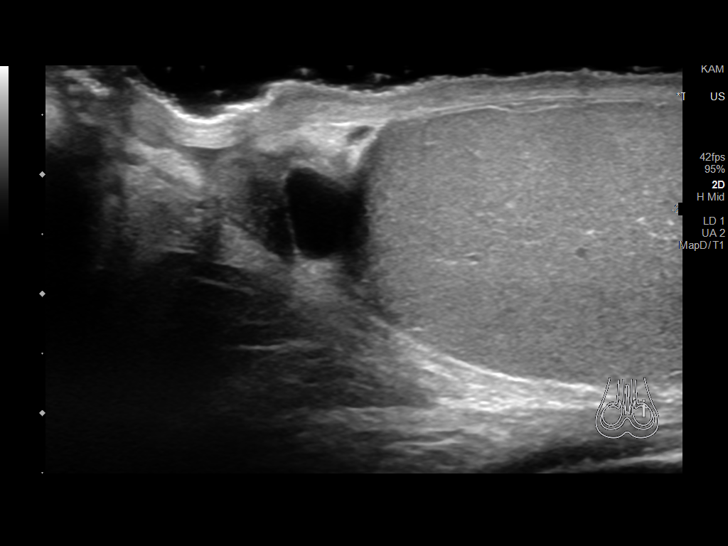
[im 68/82]
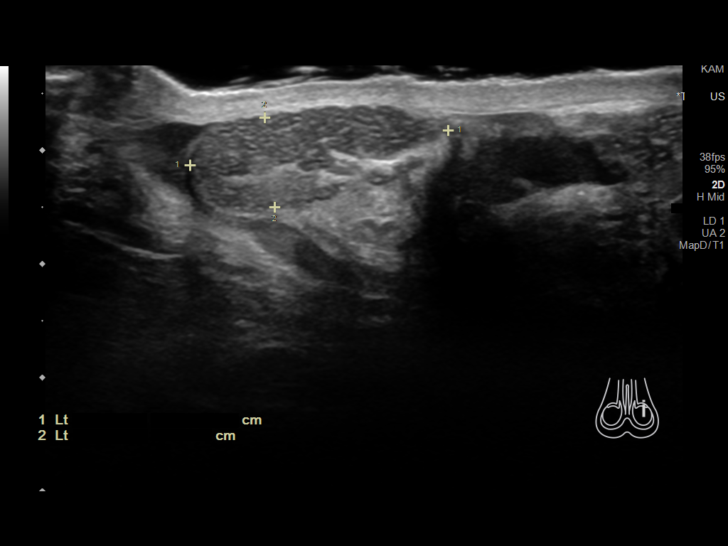
[im 75/82]
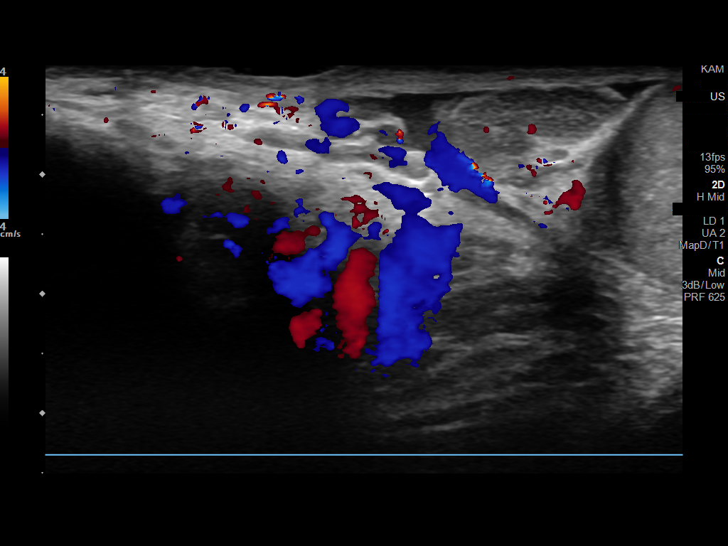
[im 82/82]
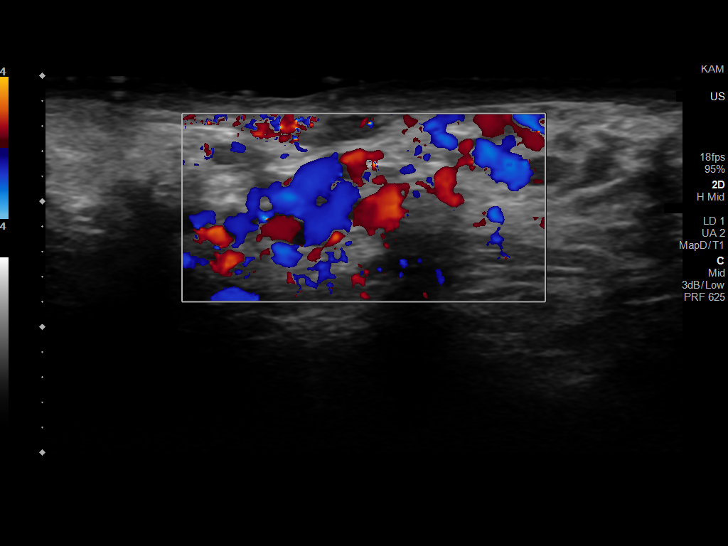

[14 of 25 positions shown; findings below may reference images not displayed]

FINDINGS: Right testicle

Measurements: 4.6 x 2.5 x 3.3 cm. No mass or microlithiasis
visualized.

Left testicle

Measurements: 4.6 x 2.1 x 2.9 cm. No mass or microlithiasis
visualized.

Right epididymis:  Normal in size and appearance.

Left epididymis: Small epididymal cyst or spermatocele measuring 8
mm maximal diameter.

Hydrocele:  Minimal bilateral hydroceles.

Varicocele:  Minimal left varicocele.

Pulsed Doppler interrogation of both testes demonstrates normal low
resistance arterial and venous waveforms bilaterally.
IMPRESSION: 1. Normal ultrasound appearance of the testicles. No evidence of
testicular mass, torsion, or inflammation.
2. Minimal bilateral hydroceles. Minimal left varicocele.

## 2022-06-22 IMAGING — US US RENAL
1 series · 14 of 25 positions shown · non-contrast
Comparison: None.

CLINICAL DATA: Left flank pain radiating to the left testicle for 4
days. Hematuria.

EXAM:
RENAL / URINARY TRACT ULTRASOUND COMPLETE

[Series 1: us renal · 14 of 56 slices shown]
[im 1/56]
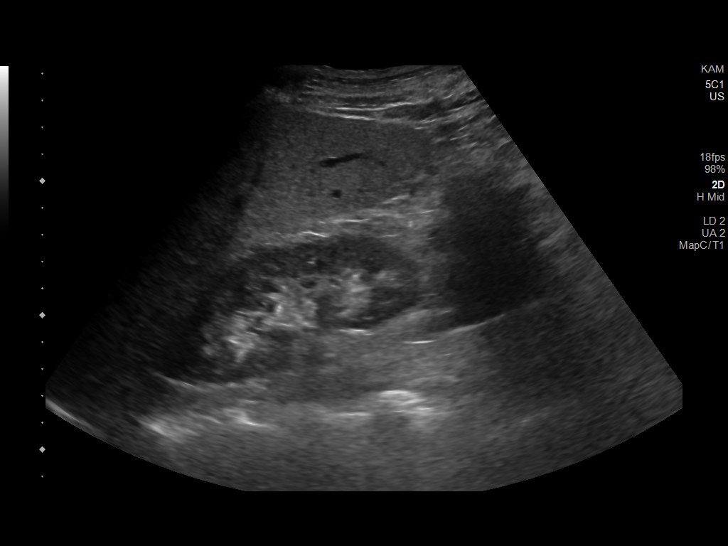
[im 5/56]
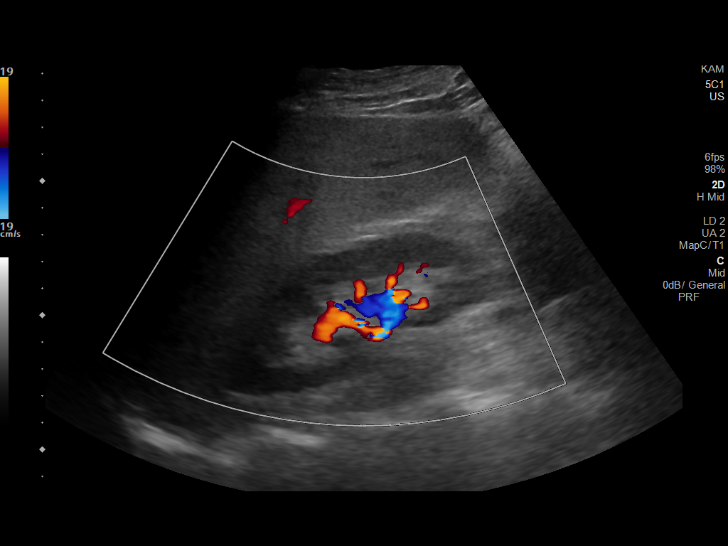
[im 10/56]
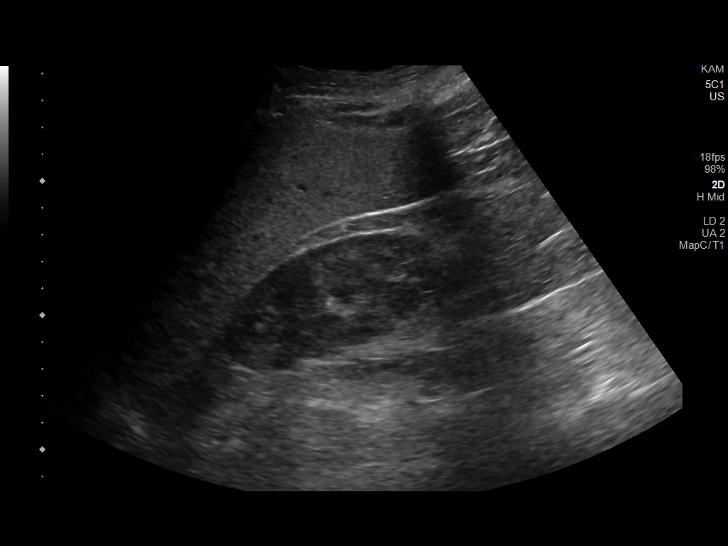
[im 14/56]
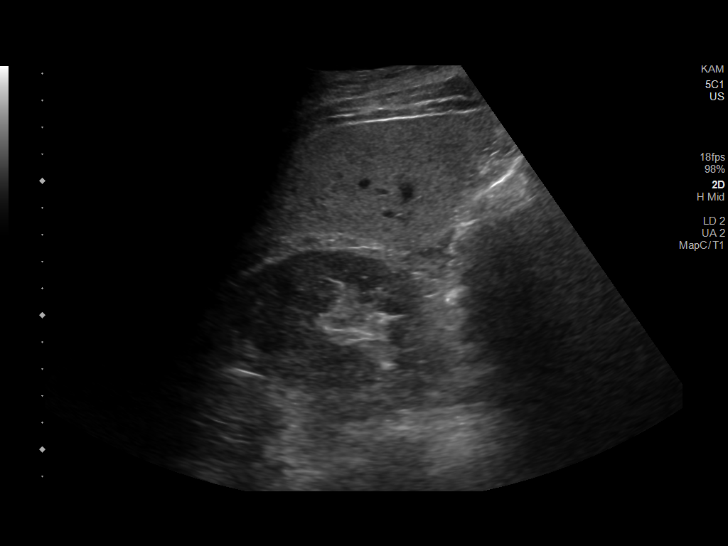
[im 19/56]
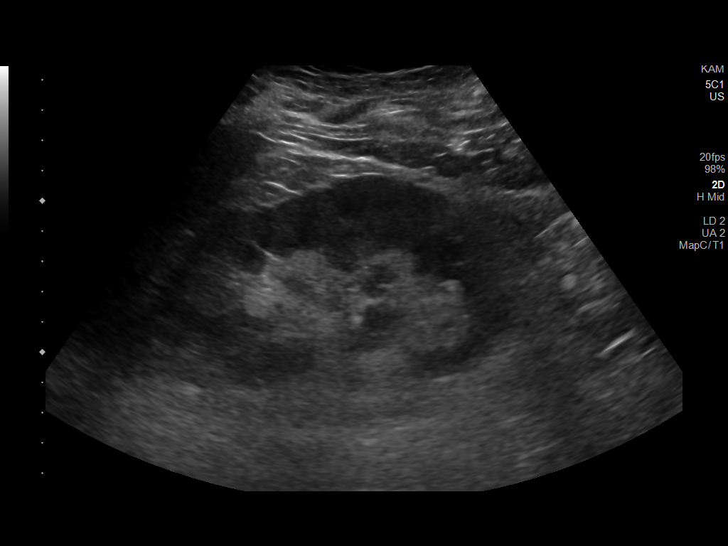
[im 21/56]
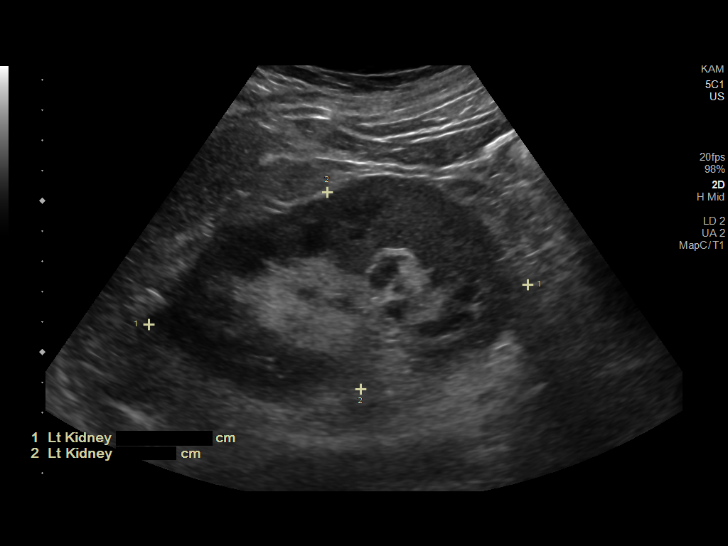
[im 26/56]
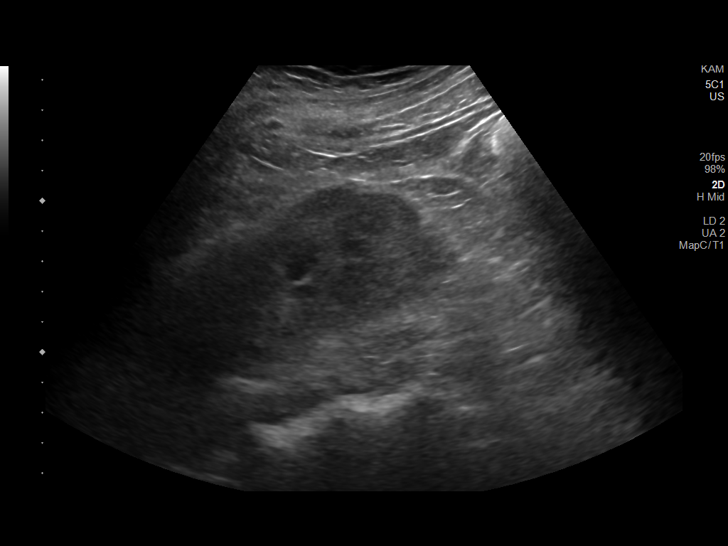
[im 30/56]
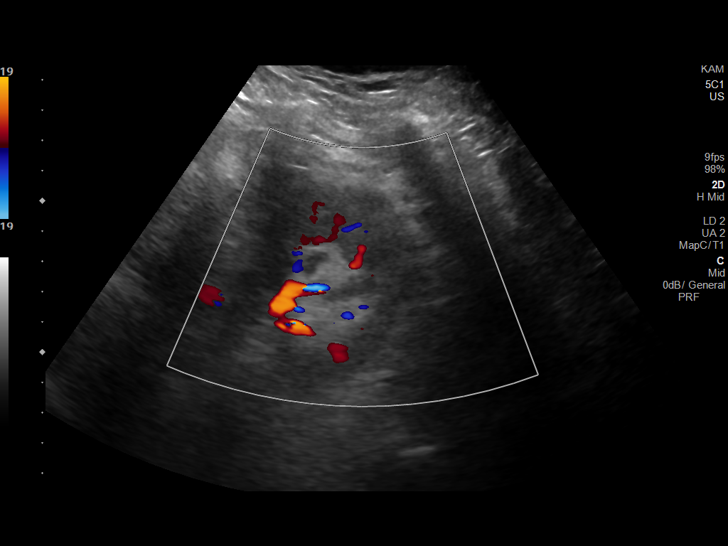
[im 35/56]
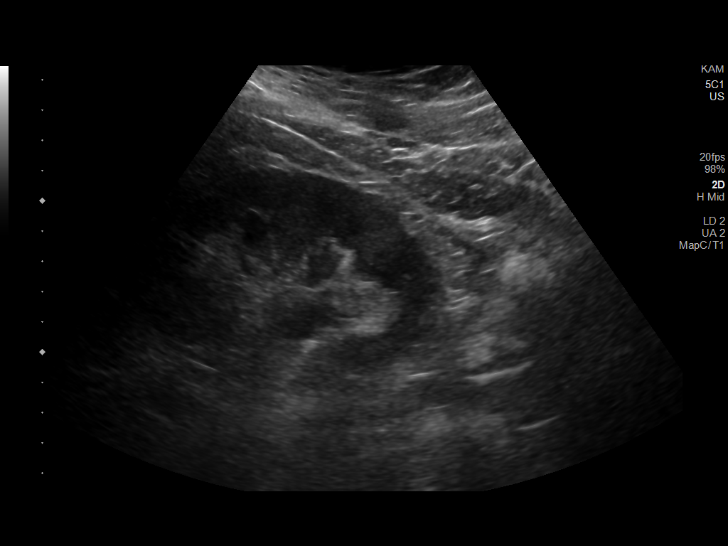
[im 37/56]
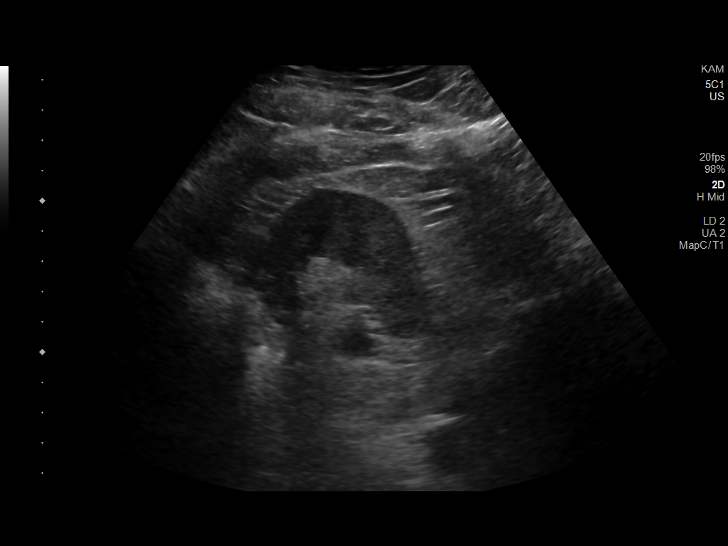
[im 42/56]
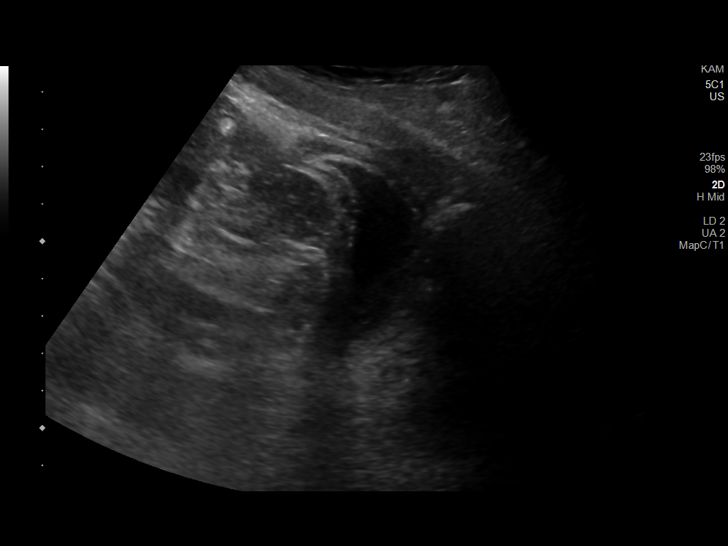
[im 46/56]
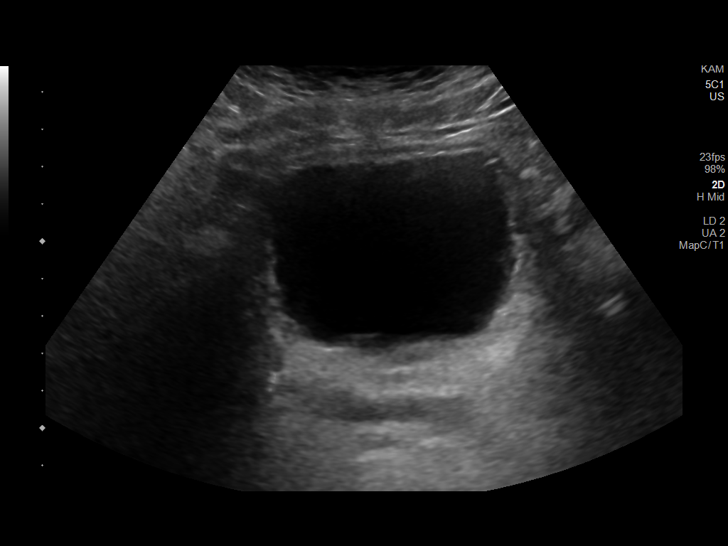
[im 51/56]
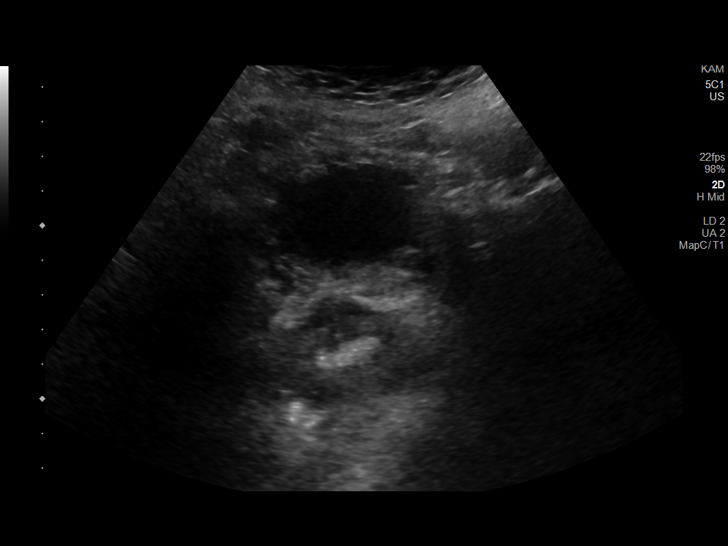
[im 56/56]
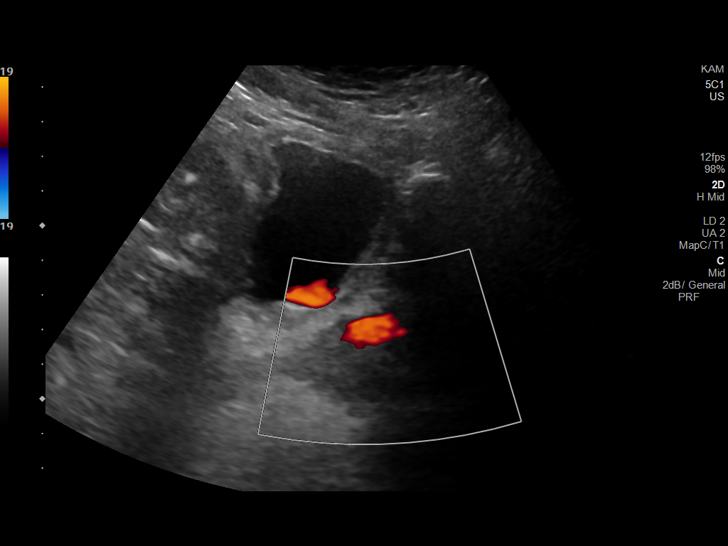

[14 of 25 positions shown; findings below may reference images not displayed]

FINDINGS: Right Kidney:

Renal measurements: 11.6 x 5 x 6 cm = volume: 179 mL. Echogenicity
within normal limits. No mass or hydronephrosis visualized.

Left Kidney:

Renal measurements: 12.6 x 7 x 6 cm = volume: 253 mL. Normal
parenchymal echotexture. A small echogenic focus is demonstrated in
the left renal pelvis with suggestion of mild hydronephrosis,
possibly representing a left pelvic stone.

Bladder:

Appears normal for degree of bladder distention.

Other:

None.
IMPRESSION: 1. Possible stone in the left renal pelvis with mild
pyelocaliectasis.
2. Right kidney and bladder are unremarkable.

## 2022-10-22 ENCOUNTER — Ambulatory Visit: Payer: 59 | Admitting: Nurse Practitioner

## 2022-10-22 ENCOUNTER — Encounter: Payer: Self-pay | Admitting: Nurse Practitioner

## 2022-10-22 VITALS — BP 116/79 | HR 78 | Ht 66.93 in | Wt 173.8 lb

## 2022-10-22 DIAGNOSIS — M25562 Pain in left knee: Secondary | ICD-10-CM

## 2022-10-22 MED ORDER — CELECOXIB 200 MG PO CAPS: 200.0000 mg | ORAL_CAPSULE | Freq: Two times a day (BID) | ORAL | 1 refills | Status: DC | PRN

## 2022-10-22 NOTE — Progress Notes (Signed)
Established patient visit   Patient: Gregory Bernard   DOB: May 22, 1971   52 y.o. Male  MRN: [id number] Visit Date: 10/22/2022  Chief Complaint  Patient presents with   Knee Pain   Subjective    Knee Pain  The incident occurred more than 1 week ago. The injury mechanism is unknown. The pain is present in the left knee. The quality of the pain is described as aching. Pain scale: constant 3/10 and will go up to 6 or 7/10. The pain is moderate. The pain has been Constant since onset. Associated symptoms include an inability to bear weight and a loss of motion. He reports no foreign bodies present. The symptoms are aggravated by movement and weight bearing. He has tried elevation, NSAIDs and immobilization for the symptoms. The treatment provided mild relief.      Medications: Outpatient Medications Prior to Visit  Medication Sig   metFORMIN (GLUCOPHAGE-XR) 500 MG 24 hr tablet Take 1 tablet (500 mg total) by mouth daily with breakfast.   No facility-administered medications prior to visit.    Review of Systems See HPI     Objective     Today's Vitals   10/22/22 1619  BP: 116/79  Pulse: 78  SpO2: 99%  Weight: 173 lb 12.8 oz (78.8 kg)  Height: 5' 6.93" (1.7 m)   Body mass index is 27.28 kg/m.   Physical Exam Vitals and nursing note reviewed.  Constitutional:      Appearance: Normal appearance. He is well-developed.  HENT:     Head: Normocephalic and atraumatic.     Nose: Nose normal.     Mouth/Throat:     Mouth: Mucous membranes are moist.     Pharynx: Oropharynx is clear.  Eyes:     Extraocular Movements: Extraocular movements intact.     Conjunctiva/sclera: Conjunctivae normal.     Pupils: Pupils are equal, round, and reactive to light.  Neck:     Vascular: No carotid bruit.  Cardiovascular:     Rate and Rhythm: Normal rate and regular rhythm.     Pulses: Normal pulses.     Heart sounds: Normal heart sounds.  Pulmonary:     Effort: Pulmonary effort is normal.      Breath sounds: Normal breath sounds.  Abdominal:     Palpations: Abdomen is soft.  Musculoskeletal:     Cervical back: Normal range of motion and neck supple.     Left knee: Swelling and crepitus present. Decreased range of motion. Tenderness present over the medial joint line and lateral joint line.  Lymphadenopathy:     Cervical: No cervical adenopathy.  Skin:    General: Skin is warm and dry.     Capillary Refill: Capillary refill takes less than 2 seconds.  Neurological:     General: No focal deficit present.     Mental Status: He is alert and oriented to person, place, and time.  Psychiatric:        Mood and Affect: Mood normal.        Behavior: Behavior normal.        Thought Content: Thought content normal.        Judgment: Judgment normal.       Assessment & Plan     Acute pain of left knee Assessment & Plan: Trial celebrex 200 mg twice daily as needed for pain/inflammation  Apply a compressive ACE bandage. Rest and elevate the affected painful area.  Apply cold compresses intermittently as needed.   X-ray left  knee for further evaluation.  Refer to orthopedics as indicated.   Orders: -     Celecoxib; Take 1 capsule (200 mg total) by mouth 2 (two) times daily as needed.  Dispense: 60 capsule; Refill: 1 -     DG Knee 1-2 Views Left     Return in about 2 weeks (around 11/05/2022) for diabetes with HgbA1c check.        Carlean Jews, NP  [facility] at [facility] [phone] (phone) [phone] (fax)  [facility]

## 2022-11-08 ENCOUNTER — Other Ambulatory Visit: Payer: Self-pay | Admitting: Nurse Practitioner

## 2022-11-08 DIAGNOSIS — E1165 Type 2 diabetes mellitus with hyperglycemia: Secondary | ICD-10-CM

## 2022-11-13 ENCOUNTER — Encounter: Payer: Self-pay | Admitting: Nurse Practitioner

## 2022-11-25 DIAGNOSIS — M25562 Pain in left knee: Secondary | ICD-10-CM | POA: Insufficient documentation

## 2022-11-25 NOTE — Assessment & Plan Note (Signed)
Trial celebrex 200 mg twice daily as needed for pain/inflammation  Apply a compressive ACE bandage. Rest and elevate the affected painful area.  Apply cold compresses intermittently as needed.   X-ray left knee for further evaluation.  Refer to orthopedics as indicated.

## 2022-11-26 NOTE — Telephone Encounter (Signed)
Pt requesting this refill to last until office visit scheduled for 12/06/2022

## 2022-12-06 ENCOUNTER — Ambulatory Visit: Payer: 59 | Admitting: Family Medicine

## 2022-12-06 ENCOUNTER — Encounter: Payer: Self-pay | Admitting: Family Medicine

## 2022-12-06 VITALS — BP 123/74 | HR 87 | Temp 98.4°F | Ht 66.93 in | Wt 174.0 lb

## 2022-12-06 DIAGNOSIS — E1165 Type 2 diabetes mellitus with hyperglycemia: Secondary | ICD-10-CM | POA: Diagnosis not present

## 2022-12-06 LAB — POCT GLYCOSYLATED HEMOGLOBIN (HGB A1C): Hemoglobin A1C: 8.4 % — AB (ref 4.0–5.6)

## 2022-12-06 MED ORDER — METFORMIN HCL ER 500 MG PO TB24
1000.0000 mg | ORAL_TABLET | Freq: Every day | ORAL | 1 refills | Status: DC
Start: 2022-12-06 — End: 2023-05-29

## 2022-12-06 NOTE — Patient Instructions (Signed)
It was nice to meet you today,  Your A1c was 8.4 today.  I would like you to increase your nighttime dose to 2 pills of your 500 mg metformin.  If you do not tolerate this medication at a higher dose let us know.  Next step would be to add another medication such as Ozempic which is a once a week injectable medication.  We can discuss that medication further at your next visit.  Please follow-up in 1 month so that we can discuss adding the Ozempic.  Have a great day,  Frederic Jericho, MD

## 2022-12-06 NOTE — Assessment & Plan Note (Signed)
A1c worsened.  Will attempt to initiate at 1000 mg nightly of metformin.  If patient does not tolerate he is to let us know in the next week.  Will also try to start Ozempic either before next visit or at next visit.  Unlikely to be at goal with just 1000 mg of metformin at night.  Counseled on proper diet.

## 2022-12-06 NOTE — Progress Notes (Signed)
   Established Patient Office Visit  Subjective   Patient ID: Gregory Bernard, male    DOB: 18-Jan-1971  Age: 52 y.o. MRN: [id number]  Chief Complaint  Patient presents with   Diabetes    HPI  Diabetes -patient is compliant with his metformin although has not taken in the last week due to not having any.  He takes 1 tablet of 500 mg every night.  If he takes it during the day or if he takes it twice a day it makes him feel that fatigue.  He also did not tolerate the Jardiance when he had that as well.  We discussed taking 2 tablets at night and seeing if he tolerates that.  We also discussed the option of Ozempic given that his A1c worsened today.  Patient has been trying to do better with his diet but still occasionally eats starches like pasta.    ROS    Objective:     BP 123/74   Pulse 87   Temp 98.4 F (36.9 C) (Oral)   Ht 5' 6.93" (1.7 m)   Wt 174 lb (78.9 kg)   SpO2 97%   BMI 27.31 kg/m    Physical Exam General: Alert, oriented Pulmonary: No respiratory distress Psych: Pleasant affect  Results for orders placed or performed in visit on 12/06/22  POCT HgB A1C  Result Value Ref Range   Hemoglobin A1C 8.4 (A) 4.0 - 5.6 %   HbA1c POC (<> result, manual entry)     HbA1c, POC (prediabetic range)     HbA1c, POC (controlled diabetic range)        The 10-year ASCVD risk score (Arnett DK, et al., 2019) is: 13.4%* (Cholesterol units were assumed)    Assessment & Plan:   Problem List Items Addressed This Visit       Endocrine   Type 2 diabetes mellitus with hyperglycemia, without long-term current use of insulin (HCC) - Primary    A1c worsened.  Will attempt to initiate at 1000 mg nightly of metformin.  If patient does not tolerate he is to let us know in the next week.  Will also try to start Ozempic either before next visit or at next visit.  Unlikely to be at goal with just 1000 mg of metformin at night.  Counseled on proper diet.      Relevant Medications    metFORMIN (GLUCOPHAGE-XR) 500 MG 24 hr tablet   Other Relevant Orders   POCT HgB A1C (Completed)    Return in about 4 weeks (around 01/03/2023) for DM.    Sandre Kitty, MD

## 2022-12-24 ENCOUNTER — Other Ambulatory Visit: Payer: Self-pay | Admitting: Nurse Practitioner

## 2022-12-24 DIAGNOSIS — M25562 Pain in left knee: Secondary | ICD-10-CM

## 2023-01-06 ENCOUNTER — Ambulatory Visit: Payer: 59 | Admitting: Family Medicine

## 2023-03-07 ENCOUNTER — Other Ambulatory Visit: Payer: Self-pay | Admitting: Nurse Practitioner

## 2023-03-07 DIAGNOSIS — M25562 Pain in left knee: Secondary | ICD-10-CM

## 2023-05-29 ENCOUNTER — Encounter: Payer: Self-pay | Admitting: Family Medicine

## 2023-05-29 ENCOUNTER — Ambulatory Visit: Payer: 59 | Admitting: Family Medicine

## 2023-05-29 VITALS — BP 116/77 | HR 86 | Ht 66.93 in | Wt 175.0 lb

## 2023-05-29 DIAGNOSIS — E785 Hyperlipidemia, unspecified: Secondary | ICD-10-CM | POA: Diagnosis not present

## 2023-05-29 DIAGNOSIS — E1169 Type 2 diabetes mellitus with other specified complication: Secondary | ICD-10-CM | POA: Diagnosis not present

## 2023-05-29 DIAGNOSIS — E1165 Type 2 diabetes mellitus with hyperglycemia: Secondary | ICD-10-CM

## 2023-05-29 LAB — POCT GLYCOSYLATED HEMOGLOBIN (HGB A1C): HbA1c POC (<> result, manual entry): 7.6 % (ref 4.0–5.6)

## 2023-05-29 MED ORDER — RYBELSUS 3 MG PO TABS: 3.0000 mg | ORAL_TABLET | Freq: Every day | ORAL | 1 refills | Status: DC

## 2023-05-29 NOTE — Patient Instructions (Signed)
It was nice to see you today,  We addressed the following topics today: -I have added a medication called Rybelsus.  I have provided information about this medication that you can read.  Take it once a day.  It does not matter if you take it in the morning or at night, which ever you tolerate better - Continue taking your cinnamon and berberine.  This might of helped your blood sugar from worsening after you stopped metformin - I would like to see you back in 1 month so we can see how you tolerate the Rybelsus and also talk about cholesterol medication.  Have a great day,  Frederic Jericho, MD

## 2023-05-29 NOTE — Assessment & Plan Note (Signed)
I do not see anywhere in the past where statin use or discussion has been documented.  I discussed the patient's cholesterol levels, the recommendation to take statins for all diabetics of his age.  Agreed to hold off until next month so we can see how his body reacts to Rybelsus.

## 2023-05-29 NOTE — Progress Notes (Signed)
   Established Patient Office Visit  Subjective   Patient ID: Gregory Bernard, male    DOB: May 19, 1971  Age: 52 y.o. MRN: [id number]  Chief Complaint  Patient presents with   Medical Management of Chronic Issues    HPI  Dm2-patient has stopped taking his metformin.  It was "tearing up my stomach".  For the past few months he has only been taking cinnamon, fish oil and berberine.  We had a long discussion about different medications.  Ultimately decided on Rybelsus.  Discussed statins and their indication in diabetics.  Agreed to hold off until next month due to not wanting to start 2 new medications at the same time in case patient develops side effects as he has with other diabetes medications.   The 10-year ASCVD risk score (Arnett DK, et al., 2019) is: 12.2%* (Cholesterol units were assumed)  Health Maintenance Due  Topic Date Due   FOOT EXAM  Never done   HIV Screening  Never done   Hepatitis C Screening  Never done   DTaP/Tdap/Td (1 - Tdap) Never done   Zoster Vaccines- Shingrix (1 of 2) Never done   Diabetic kidney evaluation - eGFR measurement  08/30/2022   OPHTHALMOLOGY EXAM  09/29/2022   Diabetic kidney evaluation - Urine ACR  10/25/2022   INFLUENZA VACCINE  02/13/2023   COVID-19 Vaccine (1 - 2023-24 season) Never done      Objective:     BP 116/77   Pulse 86   Ht 5' 6.93" (1.7 m)   Wt 175 lb (79.4 kg)   SpO2 98%   BMI 27.47 kg/m    Physical Exam General: Alert, oriented Pulmonary: No resp distress Psych: Pleasant affect.     Results for orders placed or performed in visit on 05/29/23  POCT glycosylated hemoglobin (Hb A1C)  Result Value Ref Range   Hemoglobin A1C     HbA1c POC (<> result, manual entry) 7.6 4.0 - 5.6 %   HbA1c, POC (prediabetic range)     HbA1c, POC (controlled diabetic range)          Assessment & Plan:   Type 2 diabetes mellitus with hyperglycemia, without long-term current use of insulin (HCC) Assessment & Plan: Blood sugar  7.4 today after not taking his metformin for the past 2 months at least.  Likely due to small changes in his diet as well as the berberine and possibly cinnamon he was taking.  Patient hesitant to start medications.  They are eventually agreed to start Rybelsus.  Encouraged him to continue taking berberine as well.  Orders: -     Microalbumin / creatinine urine ratio -     POCT glycosylated hemoglobin (Hb A1C)  Hyperlipidemia associated with type 2 diabetes mellitus (HCC) Assessment & Plan: I do not see anywhere in the past where statin use or discussion has been documented.  I discussed the patient's cholesterol levels, the recommendation to take statins for all diabetics of his age.  Agreed to hold off until next month so we can see how his body reacts to Rybelsus.   Other orders -     Rybelsus; Take 1 tablet (3 mg total) by mouth daily.  Dispense: 30 tablet; Refill: 1     Return in about 4 weeks (around 06/26/2023) for DM.    Sandre Kitty, MD

## 2023-05-29 NOTE — Assessment & Plan Note (Signed)
Blood sugar 7.4 today after not taking his metformin for the past 2 months at least.  Likely due to small changes in his diet as well as the berberine and possibly cinnamon he was taking.  Patient hesitant to start medications.  They are eventually agreed to start Rybelsus.  Encouraged him to continue taking berberine as well.

## 2023-05-30 LAB — MICROALBUMIN / CREATININE URINE RATIO
Creatinine, Urine: 54 mg/dL
Microalb/Creat Ratio: <6 mg/g{creat} (ref 0–29)
Microalbumin, Urine: <3 ug/mL

## 2023-06-24 ENCOUNTER — Ambulatory Visit: Payer: 59 | Admitting: Family Medicine

## 2023-06-30 ENCOUNTER — Encounter: Payer: Self-pay | Admitting: Family Medicine

## 2023-06-30 ENCOUNTER — Ambulatory Visit: Payer: 59 | Admitting: Family Medicine

## 2023-06-30 VITALS — BP 114/72 | HR 87 | Ht 66.93 in | Wt 173.1 lb

## 2023-06-30 DIAGNOSIS — E1165 Type 2 diabetes mellitus with hyperglycemia: Secondary | ICD-10-CM | POA: Diagnosis not present

## 2023-06-30 NOTE — Assessment & Plan Note (Signed)
Patient wants to hold off on increasing the Rybelsus dose to 7 mg.  Advised him that 3 mg may be too low to get the antidiabetic effects, but agreed to recheck A1c in 2 months.  Foot exam today normal.  Advised patient to reach out to insurance company for list of optometrist or ophthalmologist who can do diabetic retinopathy screening.  UACR ordered today.  Follow-up 2 months.

## 2023-06-30 NOTE — Progress Notes (Signed)
   Established Patient Office Visit  Subjective   Patient ID: Gregory Bernard, male    DOB: 1970-10-03  Age: 52 y.o. MRN: [id number]  Chief Complaint  Patient presents with   Medical Management of Chronic Issues    HPI  Dm -patient is taking 3 mg Rybelsus.  No side effects.  No complaints regarding this.  he is hesitant to start a higher dose.  Wants to see if he can continue on the 3 mg dose.  Discussed how this is generally a starting dose and may not provide enough improvement in A1c.  Discussed with patient the need for statins, yearly eye exams, foot exams and UACR testing.  The 10-year ASCVD risk score (Arnett DK, et al., 2019) is: 12.7%* (Cholesterol units were assumed)  Health Maintenance Due  Topic Date Due   FOOT EXAM  Never done   HIV Screening  Never done   Hepatitis C Screening  Never done   DTaP/Tdap/Td (1 - Tdap) Never done   Zoster Vaccines- Shingrix (1 of 2) Never done   Diabetic kidney evaluation - eGFR measurement  08/30/2022   OPHTHALMOLOGY EXAM  09/29/2022   INFLUENZA VACCINE  02/13/2023   COVID-19 Vaccine (1 - 2024-25 season) Never done      Objective:     BP 114/72   Pulse 87   Ht 5' 6.93" (1.7 m)   Wt 173 lb 1.9 oz (78.5 kg)   SpO2 98%   BMI 27.17 kg/m    Physical Exam General: Alert, oriented CV: Regular rhythm Pulmonary: No respiratory distress Psych: Pleasant affect   No results found for any visits on 06/30/23.      Assessment & Plan:   Type 2 diabetes mellitus with hyperglycemia, without long-term current use of insulin (HCC) Assessment & Plan: Patient wants to hold off on increasing the Rybelsus dose to 7 mg.  Advised him that 3 mg may be too low to get the antidiabetic effects, but agreed to recheck A1c in 2 months.  Foot exam today normal.  Advised patient to reach out to insurance company for list of optometrist or ophthalmologist who can do diabetic retinopathy screening.  UACR ordered today.  Follow-up 2 months.  Orders: -      Microalbumin / creatinine urine ratio     Return in about 2 months (around 08/31/2023) for DM.    Sandre Kitty, MD

## 2023-06-30 NOTE — Patient Instructions (Signed)
It was nice to see you today,  We addressed the following topics today: -We can continue with the 3 mg dose for now and recheck in 2 months but you need to go up to 7 mg dose of the Rybelsus - It is recommended that everybody with diabetes get yearly eye exams.  You should call your insurance company and asked them which providers are covered for diabetic retinopathy screening.  Depending on if it is an optometrist or ophthalmologist you may know it may not need a referral from me - I have provided some information on cholesterol medication.  It is recommended that everybody who is 53 years old has diabetes be on a cholesterol medication to reduce the risk of heart attack and stroke   Have a great day,  Frederic Jericho, MD

## 2023-07-01 LAB — MICROALBUMIN / CREATININE URINE RATIO
Creatinine, Urine: 104.6 mg/dL
Microalb/Creat Ratio: <3 mg/g{creat} (ref 0–29)
Microalbumin, Urine: <3 ug/mL

## 2023-08-12 ENCOUNTER — Other Ambulatory Visit: Payer: Self-pay | Admitting: Family Medicine

## 2023-08-12 ENCOUNTER — Other Ambulatory Visit: Payer: Self-pay | Admitting: Nurse Practitioner

## 2023-08-12 DIAGNOSIS — M25562 Pain in left knee: Secondary | ICD-10-CM

## 2023-08-12 MED ORDER — RYBELSUS 3 MG PO TABS: 3.0000 mg | ORAL_TABLET | Freq: Every day | ORAL | 1 refills | Status: DC

## 2023-08-12 NOTE — Telephone Encounter (Signed)
Copied from CRM [id number]. Topic: Clinical - Medication Refill >> Aug 12, 2023  9:02 AM Dondra Prader A wrote: Most Recent Primary Care Visit:  Provider: Sandre Kitty  Department: [facility]  Visit Type: OFFICE VISIT 20  Date: 06/30/2023  Medication: Semaglutide (RYBELSUS) 3 MG TABS  Has the patient contacted their pharmacy? Yes Pharmacy states that their is no refills.   Is this the correct pharmacy for this prescription? Yes If no, delete pharmacy and type the correct one.  This is the patient's preferred pharmacy:  [phone] [place] - [address] [place] [address] Phone: [phone] Fax: [phone]     Has the prescription been filled recently? No  Is the patient out of the medication? No Patient has 3 pills left and is leaving to go out of town tomorrow.  Has the patient been seen for an appointment in the last year OR does the patient have an upcoming appointment? Yes  Can we respond through MyChart? No  Agent: Please be advised that Rx refills may take up to 3 business days. We ask that you follow-up with your pharmacy.

## 2023-09-01 ENCOUNTER — Encounter: Payer: Self-pay | Admitting: Family Medicine

## 2023-09-01 ENCOUNTER — Other Ambulatory Visit: Payer: Self-pay | Admitting: Family Medicine

## 2023-09-01 ENCOUNTER — Ambulatory Visit: Payer: 59 | Admitting: Family Medicine

## 2023-09-01 ENCOUNTER — Telehealth: Payer: Self-pay

## 2023-09-01 VITALS — BP 115/75 | HR 81 | Ht 66.93 in | Wt 180.0 lb

## 2023-09-01 DIAGNOSIS — E785 Hyperlipidemia, unspecified: Secondary | ICD-10-CM

## 2023-09-01 DIAGNOSIS — E1169 Type 2 diabetes mellitus with other specified complication: Secondary | ICD-10-CM

## 2023-09-01 DIAGNOSIS — E1165 Type 2 diabetes mellitus with hyperglycemia: Secondary | ICD-10-CM | POA: Diagnosis not present

## 2023-09-01 DIAGNOSIS — Z23 Encounter for immunization: Secondary | ICD-10-CM

## 2023-09-01 LAB — POCT GLYCOSYLATED HEMOGLOBIN (HGB A1C): HbA1c POC (<> result, manual entry): 8.3 % (ref 4.0–5.6)

## 2023-09-01 MED ORDER — RYBELSUS 7 MG PO TABS: 7.0000 mg | ORAL_TABLET | Freq: Every day | ORAL | 1 refills | Status: DC

## 2023-09-01 NOTE — Progress Notes (Signed)
   Established Patient Office Visit  Subjective   Patient ID: Gregory Bernard, male    DOB: April 21, 1971  Age: 53 y.o. MRN: [id number]  Chief Complaint  Patient presents with   Medical Management of Chronic Issues    HPI  DM2-patient is taking his Rybelsus 3 mg.  We talked about his A1c increasing to 8.3.  He is okay with increasing to 7 mg of Rybelsus.  Patient has not yet scheduled appointment with the optometrist yet.  He has eye insurance.  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  We discussed following up with them regarding the results of this.  Right shoulder pain-patient states this bothers him only occasionally.  He takes Celebrex as needed for this.  No current concerns regarding this.   The 10-year ASCVD risk score (Arnett DK, et al., 2019) is: 12.9%* (Cholesterol units were assumed)  Health Maintenance Due  Topic Date Due   Pneumococcal Vaccine 66-75 Years old (1 of 2 - PCV) Never done   FOOT EXAM  Never done   HIV Screening  Never done   Hepatitis C Screening  Never done   Zoster Vaccines- Shingrix (1 of 2) Never done   Diabetic kidney evaluation - eGFR measurement  08/30/2022   OPHTHALMOLOGY EXAM  09/29/2022   INFLUENZA VACCINE  02/13/2023   COVID-19 Vaccine (1 - 2024-25 season) Never done      Objective:     BP 115/75   Pulse 81   Ht 5' 6.93" (1.7 m)   Wt 180 lb (81.6 kg)   SpO2 98%   BMI 28.25 kg/m    Physical Exam General: Alert, oriented Pulmonary: No respiratory stress Psych: Pleasant affect.   Results for orders placed or performed in visit on 09/01/23  POCT HgB A1C  Result Value Ref Range   Hemoglobin A1C     HbA1c POC (<> result, manual entry) 8.3 4.0 - 5.6 %   HbA1c, POC (prediabetic range)     HbA1c, POC (controlled diabetic range)          Assessment & Plan:   Type 2 diabetes mellitus with hyperglycemia, without long-term  current use of insulin (HCC) Assessment & Plan: A1c elevated today.  Will increase his Rybelsus to 7 mg.  Patient wants to wait until 3 months before increasing it any further 7.  Encourage patient to reach out to his eye doctor to schedule diabetic retinopathy screening.  Orders: -     POCT glycosylated hemoglobin (Hb A1C) -     Comprehensive metabolic panel  Hyperlipidemia associated with type 2 diabetes mellitus (HCC) Assessment & Plan: Will recheck lipid panel today.  Orders: -     Lipid panel  Other orders -     Tdap vaccine greater than or equal to 7yo IM -     Rybelsus; Take 1 tablet (7 mg total) by mouth daily.  Dispense: 90 tablet; Refill: 1     Return in about 3 months (around 11/29/2023) for DM.    Sandre Kitty, MD

## 2023-09-01 NOTE — Patient Instructions (Signed)
 It was nice to see you today,  We addressed the following topics today: -I am increasing your Rybelsus from 3 to 7 mg. - We are getting some lab test including cholesterol and kidney function testing. - Please call your optometrist to schedule an appointment so that you can get yearly diabetic retinopathy screening.  Have a great day,  Gregory Jericho, MD

## 2023-09-01 NOTE — Assessment & Plan Note (Signed)
 A1c elevated today.  Will increase his Rybelsus to 7 mg.  Patient wants to wait until 3 months before increasing it any further 7.  Encourage patient to reach out to his eye doctor to schedule diabetic retinopathy screening.

## 2023-09-01 NOTE — Assessment & Plan Note (Signed)
Will recheck lipid panel today 

## 2023-09-01 NOTE — Telephone Encounter (Signed)
 Called pt about his medication patient stated that he would like to stay at 3mg  for another 30 days. He stated that he will be going on a cruise. He said that he only has 4 tablet left.

## 2023-09-02 ENCOUNTER — Encounter: Payer: Self-pay | Admitting: Family Medicine

## 2023-09-02 LAB — COMPREHENSIVE METABOLIC PANEL
ALT: 30 [IU]/L (ref 0–44)
AST: 17 [IU]/L (ref 0–40)
Albumin: 4.5 g/dL (ref 3.8–4.9)
Alkaline Phosphatase: 90 [IU]/L (ref 44–121)
BUN/Creatinine Ratio: 17 (ref 9–20)
BUN: 15 mg/dL (ref 6–24)
Bilirubin Total: 1 mg/dL (ref 0.0–1.2)
CO2: 21 mmol/L (ref 20–29)
Calcium: 9.9 mg/dL (ref 8.7–10.2)
Chloride: 103 mmol/L (ref 96–106)
Creatinine, Ser: 0.9 mg/dL (ref 0.76–1.27)
Globulin, Total: 2.4 g/dL (ref 1.5–4.5)
Glucose: 231 mg/dL — ABNORMAL HIGH (ref 70–99)
Potassium: 4.8 mmol/L (ref 3.5–5.2)
Sodium: 140 mmol/L (ref 134–144)
Total Protein: 6.9 g/dL (ref 6.0–8.5)
eGFR: 102 mL/min/{1.73_m2} (ref >59)

## 2023-09-02 LAB — LIPID PANEL
Chol/HDL Ratio: 6.3 {ratio} — ABNORMAL HIGH (ref 0.0–5.0)
Cholesterol, Total: 203 mg/dL — ABNORMAL HIGH (ref 100–199)
HDL: 32 mg/dL — ABNORMAL LOW (ref >39)
LDL Chol Calc (NIH): 139 mg/dL — ABNORMAL HIGH (ref 0–99)
Triglycerides: 177 mg/dL — ABNORMAL HIGH (ref 0–149)
VLDL Cholesterol Cal: 32 mg/dL (ref 5–40)

## 2023-09-02 NOTE — Telephone Encounter (Signed)
 Can you call the patient and let him know that the insurance company will not approve the change from 3 mg to 7 mg until he is due for a refill later this month which is approximately 2/27.  If he refills the 3 mg Rybelsus now he will push back the date in which he can increase to the 7 mg by another month.  Since there are roughly 10 days before he can increase to 7 mg and he has approximately 4 tablets left, he can take his current tablets every other day for now and when he picks up a new prescription at the end of this month he needs to tell them to process the 7 mg dose and not the 3 mg dose.  The short-term decrease of his Rybelsus over the next week would be better in the long-term then to delay his increase to 7 mg by another month.  The only other alternative would be to see if the insurance and pharmacy will fill a short-term prescription for 5 tablets, which they may or may not approve.

## 2023-09-02 NOTE — Telephone Encounter (Signed)
 Called pt LVM to contact the office

## 2023-09-03 ENCOUNTER — Telehealth: Payer: Self-pay | Admitting: Family Medicine

## 2023-09-03 NOTE — Telephone Encounter (Signed)
 Called pt LVM contact the office

## 2023-09-04 NOTE — Telephone Encounter (Signed)
 Called pt he stated that she was able to pick up his 7mg  Rx at another pharmacy he said it may have over lap and he might get a bill form his insurance company

## 2023-10-17 ENCOUNTER — Other Ambulatory Visit: Payer: Self-pay | Admitting: *Deleted

## 2023-10-17 DIAGNOSIS — E1165 Type 2 diabetes mellitus with hyperglycemia: Secondary | ICD-10-CM

## 2023-10-17 MED ORDER — RYBELSUS 7 MG PO TABS: 7.0000 mg | ORAL_TABLET | Freq: Every day | ORAL | 1 refills | Status: DC

## 2023-10-17 NOTE — Telephone Encounter (Signed)
 Had to resend due to being set to print.

## 2023-10-20 ENCOUNTER — Other Ambulatory Visit: Payer: Self-pay

## 2023-10-20 ENCOUNTER — Telehealth: Payer: Self-pay

## 2023-10-20 DIAGNOSIS — E1165 Type 2 diabetes mellitus with hyperglycemia: Secondary | ICD-10-CM

## 2023-10-20 MED ORDER — RYBELSUS 7 MG PO TABS
7.0000 mg | ORAL_TABLET | Freq: Every day | ORAL | 1 refills | Status: DC
Start: 2023-10-20 — End: 2023-12-10

## 2023-10-20 NOTE — Telephone Encounter (Signed)
 The wife called back stating she would like the prescription to go to  [id number] [place], [place] - [address] Phone: [phone]  Fax: [phone]    Please assist further

## 2023-10-20 NOTE — Telephone Encounter (Signed)
 Pt wife is going to call back to let clinical staff know where to send the refill for Rybelsus.

## 2023-10-20 NOTE — Telephone Encounter (Signed)
 Rx has been sent

## 2023-12-10 ENCOUNTER — Encounter: Payer: Self-pay | Admitting: Family Medicine

## 2023-12-10 ENCOUNTER — Ambulatory Visit (INDEPENDENT_AMBULATORY_CARE_PROVIDER_SITE_OTHER): Admitting: Family Medicine

## 2023-12-10 VITALS — BP 111/75 | HR 84 | Ht 66.93 in | Wt 174.1 lb

## 2023-12-10 DIAGNOSIS — E785 Hyperlipidemia, unspecified: Secondary | ICD-10-CM

## 2023-12-10 DIAGNOSIS — E1165 Type 2 diabetes mellitus with hyperglycemia: Secondary | ICD-10-CM | POA: Diagnosis not present

## 2023-12-10 DIAGNOSIS — E1169 Type 2 diabetes mellitus with other specified complication: Secondary | ICD-10-CM

## 2023-12-10 LAB — POCT GLYCOSYLATED HEMOGLOBIN (HGB A1C): Hemoglobin A1C: 7.2 % — AB (ref 4.0–5.6)

## 2023-12-10 MED ORDER — RYBELSUS 7 MG PO TABS: 7.0000 mg | ORAL_TABLET | Freq: Every day | ORAL | 1 refills | Status: DC

## 2023-12-10 MED ORDER — PRAVASTATIN SODIUM 10 MG PO TABS: 10.0000 mg | ORAL_TABLET | Freq: Every day | ORAL | 1 refills | Status: AC

## 2023-12-10 NOTE — Assessment & Plan Note (Signed)
 7.2 A1c today, much better.  Pt desires to hold off on increasing to 14mg .  Will recheck at next visit.  Will need to get foot exam and uacr at next visit.  Advised pt to get diabetic retinopathy screening.

## 2023-12-10 NOTE — Patient Instructions (Addendum)
 It was nice to see you today,  We addressed the following topics today: -Your A1c was 7.2 which is much better.  We can hold off on increasing rybelsus  to 14 mg until the next time I see you and we have rechecked it. - I would like you to go to an eye doctor and let them know that you would like a diabetic retinopathy screen.  This is something you need to do once a year - I am sending in a cholesterol medication called a statin.  It is pravastatin.  Take it once a day.  I have provided some information on it.  Most common side effects are muscle aches and pains.   Have a great day,  Etha Henle, MD

## 2023-12-10 NOTE — Assessment & Plan Note (Signed)
 Pt agreeable to starting pravastatin  10mg .  Recheck lipid and cmp in 3 months

## 2023-12-10 NOTE — Progress Notes (Signed)
   Established Patient Office Visit  Subjective   Patient ID: Gregory Bernard, male    DOB: July 03, 1971  Age: 53 y.o. 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  ROS: Denies GI symptoms, reports fatigue   The 10-year ASCVD risk score (Arnett DK, et al., 2019) is: 10.6%  Health Maintenance Due  Topic Date Due   FOOT EXAM  Never done   HIV Screening  Never done   Hepatitis C Screening  Never done   Pneumococcal Vaccine 57-36 Years old (1 of 2 - PCV) Never done   Zoster Vaccines- Shingrix (1 of 2) Never done   OPHTHALMOLOGY EXAM  09/29/2022   COVID-19 Vaccine (1 - 2024-25 season) Never done      Objective:     BP 111/75   Pulse 84   Ht 5' 6.93" (1.7 m)   Wt 174 lb 1.9 oz (79 kg)   SpO2 97%   BMI 27.33 kg/m    Physical Exam Gen: alert, oriented Pulm: no resp distress Psych: pleasant affect   Results for orders placed or performed in visit on 12/10/23  POCT HgB A1C  Result Value Ref Range   Hemoglobin A1C 7.2 (A) 4.0 - 5.6 %   HbA1c POC (<> result, manual entry)     HbA1c, POC (prediabetic range)     HbA1c, POC (controlled diabetic range)          Assessment & Plan:   Hyperlipidemia associated with type 2 diabetes mellitus (HCC) Assessment & Plan: Pt agreeable to starting pravastatin 10mg .  Recheck lipid and cmp in 3 months  Orders: -     POCT glycosylated hemoglobin (Hb A1C)  Type 2 diabetes mellitus with hyperglycemia, without long-term current use of insulin (HCC) Assessment & Plan: 7.2  A1c today, much better.  Pt desires to hold off on increasing to 14mg .  Will recheck at next visit.  Will need to get foot exam and uacr at next visit.  Advised pt to get diabetic retinopathy screening.   Orders: -     POCT glycosylated hemoglobin (Hb A1C) -     Rybelsus ; Take 1 tablet (7 mg total) by mouth daily.  Dispense: 90 tablet; Refill: 1  Other orders -     Pravastatin Sodium; Take 1 tablet (10 mg total) by mouth daily.  Dispense: 90 tablet; Refill: 1     Return in about 3 months (around 03/11/2024) for DM, hld.    Laneta Pintos, MD

## 2024-02-09 ENCOUNTER — Other Ambulatory Visit (HOSPITAL_BASED_OUTPATIENT_CLINIC_OR_DEPARTMENT_OTHER): Payer: Self-pay | Admitting: Family Medicine

## 2024-02-09 DIAGNOSIS — Z8249 Family history of ischemic heart disease and other diseases of the circulatory system: Secondary | ICD-10-CM

## 2024-02-10 LAB — PSA
EGFR: 90
PSA: 0.8

## 2024-02-11 ENCOUNTER — Ambulatory Visit (HOSPITAL_BASED_OUTPATIENT_CLINIC_OR_DEPARTMENT_OTHER)
Admission: RE | Admit: 2024-02-11 | Discharge: 2024-02-11 | Disposition: A | Discharge status: Home / Self Care | Payer: Self-pay | Source: Ambulatory Visit | Attending: Family Medicine | Admitting: Family Medicine

## 2024-02-11 DIAGNOSIS — Z8249 Family history of ischemic heart disease and other diseases of the circulatory system: Secondary | ICD-10-CM

## 2024-02-26 LAB — HM DIABETES EYE EXAM

## 2024-03-03 ENCOUNTER — Other Ambulatory Visit: Payer: Self-pay | Admitting: *Deleted

## 2024-03-03 DIAGNOSIS — E1169 Type 2 diabetes mellitus with other specified complication: Secondary | ICD-10-CM

## 2024-03-03 DIAGNOSIS — E1165 Type 2 diabetes mellitus with hyperglycemia: Secondary | ICD-10-CM

## 2024-03-04 ENCOUNTER — Other Ambulatory Visit

## 2024-03-04 DIAGNOSIS — E1169 Type 2 diabetes mellitus with other specified complication: Secondary | ICD-10-CM

## 2024-03-04 DIAGNOSIS — E1165 Type 2 diabetes mellitus with hyperglycemia: Secondary | ICD-10-CM

## 2024-03-05 ENCOUNTER — Other Ambulatory Visit

## 2024-03-05 ENCOUNTER — Ambulatory Visit: Payer: Self-pay | Admitting: Family Medicine

## 2024-03-05 LAB — LIPID PANEL
Chol/HDL Ratio: 7 ratio — ABNORMAL HIGH (ref 0.0–5.0)
Cholesterol, Total: 209 mg/dL — ABNORMAL HIGH (ref 100–199)
HDL: 30 mg/dL — ABNORMAL LOW (ref >39)
LDL Chol Calc (NIH): 146 mg/dL — ABNORMAL HIGH (ref 0–99)
Triglycerides: 178 mg/dL — ABNORMAL HIGH (ref 0–149)
VLDL Cholesterol Cal: 33 mg/dL (ref 5–40)

## 2024-03-05 LAB — COMPREHENSIVE METABOLIC PANEL WITH GFR
ALT: 31 IU/L (ref 0–44)
AST: 18 IU/L (ref 0–40)
Albumin: 4.6 g/dL (ref 3.8–4.9)
Alkaline Phosphatase: 97 IU/L (ref 44–121)
BUN/Creatinine Ratio: 14 (ref 9–20)
BUN: 13 mg/dL (ref 6–24)
Bilirubin Total: 0.9 mg/dL (ref 0.0–1.2)
CO2: 19 mmol/L — ABNORMAL LOW (ref 20–29)
Calcium: 9.5 mg/dL (ref 8.7–10.2)
Chloride: 101 mmol/L (ref 96–106)
Creatinine, Ser: 0.93 mg/dL (ref 0.76–1.27)
Globulin, Total: 2.3 g/dL (ref 1.5–4.5)
Glucose: 177 mg/dL — ABNORMAL HIGH (ref 70–99)
Potassium: 4.5 mmol/L (ref 3.5–5.2)
Sodium: 136 mmol/L (ref 134–144)
Total Protein: 6.9 g/dL (ref 6.0–8.5)
eGFR: 98 mL/min/1.73 (ref >59)

## 2024-03-05 LAB — HEMOGLOBIN A1C
Est. average glucose Bld gHb Est-mCnc: 194 mg/dL
Hgb A1c MFr Bld: 8.4 % — ABNORMAL HIGH (ref 4.8–5.6)

## 2024-03-11 ENCOUNTER — Encounter: Payer: Self-pay | Admitting: Family Medicine

## 2024-03-11 ENCOUNTER — Ambulatory Visit (INDEPENDENT_AMBULATORY_CARE_PROVIDER_SITE_OTHER): Admitting: Family Medicine

## 2024-03-11 VITALS — BP 112/74 | HR 80 | Ht 66.93 in | Wt 174.4 lb

## 2024-03-11 DIAGNOSIS — E1169 Type 2 diabetes mellitus with other specified complication: Secondary | ICD-10-CM | POA: Diagnosis not present

## 2024-03-11 DIAGNOSIS — E1165 Type 2 diabetes mellitus with hyperglycemia: Secondary | ICD-10-CM

## 2024-03-11 DIAGNOSIS — M65949 Unspecified synovitis and tenosynovitis, unspecified hand: Secondary | ICD-10-CM | POA: Diagnosis not present

## 2024-03-11 DIAGNOSIS — E785 Hyperlipidemia, unspecified: Secondary | ICD-10-CM | POA: Diagnosis not present

## 2024-03-11 DIAGNOSIS — Z7984 Long term (current) use of oral hypoglycemic drugs: Secondary | ICD-10-CM | POA: Diagnosis not present

## 2024-03-11 MED ORDER — RYBELSUS 14 MG PO TABS: 14.0000 mg | ORAL_TABLET | Freq: Every day | ORAL | 1 refills | Status: AC

## 2024-03-11 MED ORDER — EZETIMIBE 10 MG PO TABS: 10.0000 mg | ORAL_TABLET | Freq: Every day | ORAL | 3 refills | Status: AC

## 2024-03-11 NOTE — Patient Instructions (Signed)
 It was nice to see you today,  We addressed the following topics today: -I have sent in a medication called Zetia .  If you use GoodRx coupons it should not cost you more than $40 [facility] - I increased your Rybelsus .  Use a 7 mg you can dose by taking two 7 mg tablets daily until you run out of them.  Then you can start the 14 mg dose. - I have sent in a referral to sports medicine for your hand pain.  I think it is tendinitis rather than arthritis but they can further evaluate you there and discuss further treatment options.  Have a great day,  Rolan Slain, MD

## 2024-03-11 NOTE — Assessment & Plan Note (Signed)
-   Chronic left hand pain and decreased grip strength for 2 years, localized to the knuckles and fingers, worse with gripping. Exam suggests tendinitis. - Refer to Sports Medicine for further evaluation, likely including ultrasound of the hand to assess tendons and rule out arthritis. - Will send a request for records from the recent eye exam. - Follow up in 3 months.

## 2024-03-11 NOTE — Assessment & Plan Note (Signed)
-   History of hyperlipidemia. Intolerant to a trial of an unspecified statin due to side effects. Discussed non-statin options. - Start Zetia  (ezetimibe ) 10 mg daily. [facility] [place]. - Counseled that Zetia  may not be as effective as a statin but has fewer side effects. - Discussed cost and use of GoodRx if insurance does not cover it, which would be approximately $40 for a 14-month supply. - Will recheck lipids at next follow-up.

## 2024-03-11 NOTE — Assessment & Plan Note (Signed)
-   Poorly controlled with recent A1c of 8.4, increased from 7.2. Patient has a history of intolerance to metformin  and Jardiance . Currently on Rybelsus  7mg . Denies side effects from Rybelsus . - Increase Rybelsus  to 14 mg daily. - Patient has a new 30-day supply of Rybelsus  7 mg. Instructed to take two 7 mg tablets daily until the supply is finished, then start the 14 mg prescription. - Counseled on potential side effects of nausea and constipation, which typically improve after the first few weeks. - Counseled on the importance of diet, specifically limiting carbohydrates, starches, and liquid calories. - Performed annual diabetic foot exam, which was normal. - Urine sample collected for microalbumin/creatinine ratio.

## 2024-03-11 NOTE — Progress Notes (Signed)
 Established Patient Office Visit  Subjective   Patient ID: Gregory Bernard, male    DOB: Jul 12, 1971  Age: 53 y.o. MRN: [id number]  Chief Complaint  Patient presents with   Medical Management of Chronic Issues    HPI  Subjective - Follow-up for diabetes and hyperlipidemia. - Reports recent eye exam at [facility] in [place] was normal with no evidence of diabetic retinopathy. - Reports non-adherence to statin medication due to side effects. Took it for about 5 weeks then stopped due to feeling unwell. This was the first statin tried. - Reports hand issue for approximately 2 years, believed to be arthritis. Decreased grip strength in the left hand, particularly affecting the index and middle fingers. Reports pain in the hand and knuckles when gripping. No swelling. Reports a clicking sensation with pain on finger extension. The issue is constant, not worsening or improving. Denies recent trauma. Reports history of using a lawn mower with constant gripping motion. - Discussed diet and exercise. 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 Acknowledges diet consists of fast food due to convenience. Works 24-hour shifts.  Medications Medications reviewed include Rybelsus  7 mg, intolerance to metformin , intolerance to Jardiance , and a trial of an unspecified statin which was discontinued due to side effects.  PMH, PSH, FH, Social Hx PMHx: Diabetes Mellitus Type 2, Hyperlipidemia. Social Hx: Reports quitting smoking in January of the previous year.  ROS Constitutional: Denies fever, chills. Reports sedentary lifestyle. Eyes: Reports recent normal eye exam. Denies vision changes. MSK: Reports left hand pain and decreased grip strength. No swelling reported. Neuro: Denies numbness or tingling in the feet.   The 10-year ASCVD risk score (Arnett DK, et al., 2019) is: 11.8%  Health Maintenance Due  Topic Date Due   HIV Screening  Never done   Hepatitis C  Screening  Never done   Hepatitis B Vaccines 19-59 Average Risk (1 of 3 - 19+ 3-dose series) Never done   OPHTHALMOLOGY EXAM  09/29/2022   COVID-19 Vaccine (1 - 2024-25 season) Never done   INFLUENZA VACCINE  02/13/2024      Objective:     BP 112/74   Pulse 80   Ht 5' 6.93 (1.7 m)   Wt 174 lb 6.4 oz (79.1 kg)   SpO2 99%   BMI 27.37 kg/m    Physical Exam Gen: alert, oriented Msk: no swelling of the hands.  Decreeased passive and active rom of the 2nd, 3rd digit of the left hand.   Ext: normal foot exam with normal monofilament testing    No results found for any visits on 03/11/24.      Assessment & Plan:   Type 2 diabetes mellitus with hyperglycemia, without long-term current use of insulin (HCC) Assessment & Plan: - Poorly controlled with recent A1c of 8.4, increased from 7.2. Patient has a history of intolerance to metformin  and Jardiance . Currently on Rybelsus  7mg . Denies side effects from Rybelsus . - Increase Rybelsus  to 14 mg daily. - Patient has a new 30-day supply of Rybelsus  7 mg. Instructed to take two 7 mg tablets daily until the supply is finished, then start the 14 mg prescription. - Counseled on potential side effects of nausea and constipation, which typically improve after the first few weeks. - Counseled on the importance of diet, specifically limiting carbohydrates, starches, and liquid calories. - Performed annual diabetic foot exam, which was normal. - Urine sample collected for microalbumin/creatinine ratio.  Orders: -  Microalbumin / creatinine urine ratio  Tenosynovitis of finger and hand Assessment & Plan: - Chronic left hand pain and decreased grip strength for 2 years, localized to the knuckles and fingers, worse with gripping. Exam suggests tendinitis. - Refer to Sports Medicine for further evaluation, likely including ultrasound of the hand to assess tendons and rule out arthritis. - Will send a request for records from the recent eye  exam. - Follow up in 3 months.  Orders: -     Ambulatory referral to Sports Medicine  Hyperlipidemia associated with type 2 diabetes mellitus (HCC) Assessment & Plan: - History of hyperlipidemia. Intolerant to a trial of an unspecified statin due to side effects. Discussed non-statin options. - Start Zetia  (ezetimibe ) 10 mg daily. Prescription sent to [facility] in [place]. - Counseled that Zetia  may not be as effective as a statin but has fewer side effects. - Discussed cost and use of GoodRx if insurance does not cover it, which would be approximately $40 for a 74-month supply. - Will recheck lipids at next follow-up.   Other orders -     Ezetimibe ; Take 1 tablet (10 mg total) by mouth daily.  Dispense: 90 tablet; Refill: 3 -     Rybelsus ; Take 1 tablet (14 mg total) by mouth daily.  Dispense: 90 tablet; Refill: 1     Return in about 3 months (around 06/11/2024) for DM.    Toribio MARLA Slain, MD

## 2024-03-12 LAB — MICROALBUMIN / CREATININE URINE RATIO
Creatinine, Urine: 172.9 mg/dL
Microalb/Creat Ratio: 6 mg/g{creat} (ref 0–29)
Microalbumin, Urine: 10.6 ug/mL

## 2024-03-24 ENCOUNTER — Other Ambulatory Visit: Payer: Self-pay

## 2024-03-24 ENCOUNTER — Ambulatory Visit

## 2024-03-24 VITALS — BP 112/76 | Ht 67.0 in | Wt 175.0 lb

## 2024-03-24 DIAGNOSIS — M79642 Pain in left hand: Secondary | ICD-10-CM

## 2024-03-26 NOTE — Progress Notes (Addendum)
 PCP: Chandra Toribio POUR, MD  Subjective:   HPI: Patient is a 53 y.o. male here for left hand pain.  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  He states that for the past year plus he has had difficulty with making a fist due to tightening up his 2nd and 3rd fingers.  He states this is worse on the left but also present on the right.  He states that the pain is mostly over the dorsum of his 2nd and 3rd fingers whenever he is flexing.  He denies any injuries, tingling, numbness.  He will use anti-inflammatories periodically.  Past Medical History:  Diagnosis Date   Diverticulitis     Current Outpatient Medications on File Prior to Visit  Medication Sig Dispense Refill   celecoxib  (CELEBREX ) 200 MG capsule TAKE 1 CAPSULE (200 MG TOTAL) BY MOUTH 2 (TWO) TIMES DAILY AS NEEDED. (Patient not taking: Reported on 12/10/2023) 60 capsule 1   ezetimibe  (ZETIA ) 10 MG tablet Take 1 tablet (10 mg total) by mouth daily. 90 tablet 3   pravastatin  (PRAVACHOL ) 10 MG tablet Take 1 tablet (10 mg total) by mouth daily. 90 tablet 1   Semaglutide  (RYBELSUS ) 14 MG TABS Take 1 tablet (14 mg total) by mouth daily. 90 tablet 1   No current facility-administered medications on file prior to visit.    BP 112/76   Ht 5' 7 (1.702 m)   Wt 175 lb (79.4 kg)   BMI 27.41 kg/m        Objective:   Physical Exam:  Gen: NAD, comfortable in exam room Left hand Inspection: No edema, erythema or warmth Palpation: Patient has tenderness to palpation over the 2nd and 3rd MCP joints dorsally ROM: Patient is unable to fully flex his fingers into a fist at the 2nd and 3rd finger, he can fully extend all fingers without difficulty, no catching or locking, no sensation of fullness at the A1 pulley Special Tests: Negative Tinel's Neuro: Sensation is intact bilaterally, capillary refills normal, grip strength is equal  Limited US  of the left dorsal Wrist:   1st compartment: visualized in SAX -no  hypoechoic change  2nd compartment: ECRB and ECRL with no hypoechoic change  3rd compartment: EPL with some hypoechoic fluid 4th compartment: ED and EI visualized and SAX.  There are hypoechoic fluid changes within the tendon sheath. 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 male who was seen today for the following: 1. Left hand pain (Primary) - US  LIMITED JOINT SPACE STRUCTURES UP LEFT; Future - DG Hand Complete Right; Future - DG Hand Complete Left; Future - Patient's ultrasound is significant for fluid surrounding the fourth compartment - Also arthritic change noted at 2nd and 3rd MCP joints - Will obtain bilateral hand x-rays as patient's symptoms are present bilaterally - Will patient placement in a cock wrist splint to help relieve irritation at the dorsal compartment - Will follow-up with patient in a few weeks  - He can continue antiinflammatories as needed   Follow-up/Education:   No follow-ups on file.   May return sooner as needed and encouraged to call/e-mail for  additional questions or  worsening symptoms in the interim.  Krystal Lowing, DO Sports Medicine Fellow 03/26/2024 4:06 PM

## 2024-06-15 ENCOUNTER — Ambulatory Visit (INDEPENDENT_AMBULATORY_CARE_PROVIDER_SITE_OTHER): Admitting: Family Medicine

## 2024-06-15 ENCOUNTER — Encounter: Payer: Self-pay | Admitting: Family Medicine

## 2024-06-15 VITALS — BP 104/71 | HR 80 | Ht 67.0 in | Wt 172.8 lb

## 2024-06-15 DIAGNOSIS — E1165 Type 2 diabetes mellitus with hyperglycemia: Secondary | ICD-10-CM | POA: Diagnosis not present

## 2024-06-15 DIAGNOSIS — Z7984 Long term (current) use of oral hypoglycemic drugs: Secondary | ICD-10-CM | POA: Diagnosis not present

## 2024-06-15 LAB — POCT GLYCOSYLATED HEMOGLOBIN (HGB A1C): HbA1c POC (<> result, manual entry): 8 % (ref 4.0–5.6)

## 2024-06-15 MED ORDER — GLIPIZIDE ER 5 MG PO TB24: ORAL_TABLET | ORAL | 3 refills | Status: AC

## 2024-06-15 NOTE — Patient Instructions (Signed)
 It was nice to see you today,  We addressed the following topics today: - Continue your current medications, including Rybelsus . - I have sent a prescription for glipizide to your pharmacy. Please start taking one pill each day with your first large meal. You can take it right after you eat. If you have a light breakfast, wait and take it with lunch. - If you experience any symptoms of low blood sugar (like shakiness, sweating, confusion), please let us  know. - Focus on a low-carbohydrate, high-protein diet for the next month to help lower your blood sugar. - Continue your exercise routine. - Please schedule a lab-only visit around 07/03/2024 for a repeat A1c check.  Have a great day,  Rolan Slain, MD

## 2024-06-15 NOTE — Assessment & Plan Note (Signed)
 Type 2 Diabetes, uncontrolled: History of elevated A1c, currently 8.0, down from 8.2. Patient has failed metformin  and Jardiance . Currently on Rybelsus , which is tolerated with some dose reduction. Multiple options discussed including switching to an injectable GLP-1 agonist (e.g., Ozempic, Zepbound), trying Farxiga (another SGLT2 inhibitor), or adding glipizide. The most immediate goal is to lower A1c below 8.0 within the next month to meet work requirements. - Continue Rybelsus  as tolerated. - Add glipizide xr 5mg  once daily with the first large meal of the day. - Counseled on the risk of hypoglycemia with glipizide, especially if a meal is skipped after taking the medication. - Plan to recheck A1c via in-office fingerstick in approximately 4 weeks, around 07/03/2024, to assess for improvement before completing work forms.

## 2024-06-15 NOTE — Progress Notes (Unsigned)
 Established Patient Office Visit  Subjective   Patient ID: Gregory Bernard, male    DOB: 05-Dec-1970  Age: 53 y.o. MRN: [id number]  Chief Complaint  Patient presents with   Medical Management of Chronic Issues    HPI  Subjective - Follow-up for diabetes management. A1c remains elevated at 8.0. Needs to meet criteria for a work-related form requiring an A1c < 8.0. - Reports occasional heartburn, possibly related to Rybelsus . - Trying to reduce carbohydrate intake. Reports low intake of sugary foods and drinks. Drinks diet soft drinks. - Recently resumed exercising, using an elliptical machine. - Reports morning fingerstick glucose of 116 and 108 in the last two days.  Medications Rybelsus  14 mg, reports taking half a tablet once or twice a week due to stomach upset but is otherwise tolerating the increased dose. Berberine two tablets daily. Cinnamon supplement.  PMH, PSH, FH, Social Hx PMHx: Type 2 diabetes, hyperlipidemia. Intolerance to metformin  (GI side effects) and Jardiance  (grogginess, dry mouth).  ROS Endocrine: No hypoglycemia. Reports feeling full faster and that meals feel heavier, attributes this to medication. GI: Reports increased heartburn. No nausea. Neuro: No numbness in feet.   The 10-year ASCVD risk score (Arnett DK, et al., 2019) is: 11.2%  Health Maintenance Due  Topic Date Due   HIV Screening  Never done   Hepatitis C Screening  Never done   Hepatitis B Vaccines 19-59 Average Risk (1 of 3 - 19+ 3-dose series) Never done   Zoster Vaccines- Shingrix (1 of 2) Never done   COVID-19 Vaccine (1 - 2025-26 season) Never done      Objective:     BP 104/71   Pulse 80   Ht 5' 7 (1.702 m)   Wt 172 lb 12.8 oz (78.4 kg)   SpO2 99%   BMI 27.06 kg/m  {Vitals History (Optional):23777}  Physical Exam Gen: alert, oriented Pulm: no respiratory distress Psych: pleasant affect   Results for orders placed or performed in visit on 06/15/24  POCT  glycosylated hemoglobin (Hb A1C)  Result Value Ref Range   Hemoglobin A1C     HbA1c POC (<> result, manual entry) 8.0 4.0 - 5.6 %   HbA1c, POC (prediabetic range)     HbA1c, POC (controlled diabetic range)          Assessment & Plan:   Type 2 diabetes mellitus with hyperglycemia, without long-term current use of insulin (HCC) Assessment & Plan: Type 2 Diabetes, uncontrolled: History of elevated A1c, currently 8.0, down from 8.2. Patient has failed metformin  and Jardiance . Currently on Rybelsus , which is tolerated with some dose reduction. Multiple options discussed including switching to an injectable GLP-1 agonist (e.g., Ozempic, Zepbound), trying Farxiga (another SGLT2 inhibitor), or adding glipizide. The most immediate goal is to lower A1c below 8.0 within the next month to meet work requirements. - Continue Rybelsus  as tolerated. - Add glipizide xr 5mg  once daily with the first large meal of the day. - Counseled on the risk of hypoglycemia with glipizide, especially if a meal is skipped after taking the medication. - Plan to recheck A1c via in-office fingerstick in approximately 4 weeks, around 07/03/2024, to assess for improvement before completing work forms.  Orders: -     POCT glycosylated hemoglobin (Hb A1C)  Other orders -     glipiZIDE ER; Take 1 tablet orally with first large meal of the day  Dispense: 90 tablet; Refill: 3     Return in about 3 months (around 09/13/2024) for DM.  Toribio MARLA Slain, MD

## 2024-06-17 ENCOUNTER — Encounter: Payer: Self-pay | Admitting: Family Medicine

## 2024-07-06 ENCOUNTER — Ambulatory Visit (INDEPENDENT_AMBULATORY_CARE_PROVIDER_SITE_OTHER): Admitting: Family Medicine

## 2024-07-06 VITALS — BP 118/75 | HR 82 | Ht 67.0 in | Wt 173.5 lb

## 2024-07-06 DIAGNOSIS — E1165 Type 2 diabetes mellitus with hyperglycemia: Secondary | ICD-10-CM | POA: Diagnosis not present

## 2024-07-06 DIAGNOSIS — Z7984 Long term (current) use of oral hypoglycemic drugs: Secondary | ICD-10-CM

## 2024-07-06 LAB — POCT GLYCOSYLATED HEMOGLOBIN (HGB A1C): Hemoglobin A1C: 7.4 % — AB (ref 4.0–5.6)

## 2024-07-06 NOTE — Progress Notes (Signed)
 Pt in office for A1c and form completion,form signed and given to pt.

## 2024-09-07 ENCOUNTER — Other Ambulatory Visit

## 2024-09-14 ENCOUNTER — Ambulatory Visit: Admitting: Family Medicine
